# Patient Record
Sex: Female | Born: 1965 | Race: Black or African American | Hispanic: No | Marital: Single | Smoking: Former smoker
Health system: Southern US, Community
[De-identification: ages and names within clinical notes are randomized; demographics above are authoritative.]

## PROBLEM LIST (undated history)

## (undated) DIAGNOSIS — Z1211 Encounter for screening for malignant neoplasm of colon: Secondary | ICD-10-CM

## (undated) DIAGNOSIS — M1990 Unspecified osteoarthritis, unspecified site: Secondary | ICD-10-CM

## (undated) DIAGNOSIS — A6000 Herpesviral infection of urogenital system, unspecified: Secondary | ICD-10-CM

## (undated) DIAGNOSIS — K219 Gastro-esophageal reflux disease without esophagitis: Secondary | ICD-10-CM

## (undated) DIAGNOSIS — T7840XA Allergy, unspecified, initial encounter: Secondary | ICD-10-CM

## (undated) DIAGNOSIS — R1013 Epigastric pain: Secondary | ICD-10-CM

## (undated) DIAGNOSIS — U071 COVID-19: Secondary | ICD-10-CM

## (undated) DIAGNOSIS — G43909 Migraine, unspecified, not intractable, without status migrainosus: Secondary | ICD-10-CM

## (undated) DIAGNOSIS — I10 Essential (primary) hypertension: Secondary | ICD-10-CM

## (undated) DIAGNOSIS — D219 Benign neoplasm of connective and other soft tissue, unspecified: Secondary | ICD-10-CM

## (undated) DIAGNOSIS — E785 Hyperlipidemia, unspecified: Secondary | ICD-10-CM

## (undated) DIAGNOSIS — A630 Anogenital (venereal) warts: Secondary | ICD-10-CM

## (undated) DIAGNOSIS — D72820 Lymphocytosis (symptomatic): Secondary | ICD-10-CM

## (undated) HISTORY — DX: Unspecified osteoarthritis, unspecified site: M19.90

## (undated) HISTORY — DX: Herpesviral infection of urogenital system, unspecified: A60.00

## (undated) HISTORY — DX: Hyperlipidemia, unspecified: E78.5

## (undated) HISTORY — DX: COVID-19: U07.1

## (undated) HISTORY — DX: Allergy, unspecified, initial encounter: T78.40XA

## (undated) HISTORY — DX: Anogenital (venereal) warts: A63.0

## (undated) HISTORY — DX: Lymphocytosis (symptomatic): D72.820

## (undated) HISTORY — DX: Gastro-esophageal reflux disease without esophagitis: K21.9

## (undated) HISTORY — PX: EMBOLIZATION: SHX1496

## (undated) HISTORY — DX: Epigastric pain: R10.13

## (undated) HISTORY — DX: Encounter for screening for malignant neoplasm of colon: Z12.11

## (undated) HISTORY — DX: Benign neoplasm of connective and other soft tissue, unspecified: D21.9

## (undated) HISTORY — PX: ECTOPIC PREGNANCY SURGERY: SHX613

## (undated) HISTORY — DX: Migraine, unspecified, not intractable, without status migrainosus: G43.909

## (undated) HISTORY — DX: Essential (primary) hypertension: I10

---

## 2016-12-07 ENCOUNTER — Encounter (INDEPENDENT_AMBULATORY_CARE_PROVIDER_SITE_OTHER): Payer: Self-pay

## 2016-12-07 ENCOUNTER — Encounter: Payer: Self-pay | Admitting: Family Medicine

## 2016-12-07 ENCOUNTER — Ambulatory Visit (INDEPENDENT_AMBULATORY_CARE_PROVIDER_SITE_OTHER): Payer: BC Managed Care – PPO | Admitting: Family Medicine

## 2016-12-07 VITALS — BP 104/78 | HR 77 | Temp 98.3°F | Ht 68.25 in | Wt 211.4 lb

## 2016-12-07 DIAGNOSIS — Z86018 Personal history of other benign neoplasm: Secondary | ICD-10-CM

## 2016-12-07 DIAGNOSIS — Z23 Encounter for immunization: Secondary | ICD-10-CM | POA: Diagnosis not present

## 2016-12-07 DIAGNOSIS — E669 Obesity, unspecified: Secondary | ICD-10-CM | POA: Diagnosis not present

## 2016-12-07 DIAGNOSIS — Z1239 Encounter for other screening for malignant neoplasm of breast: Secondary | ICD-10-CM

## 2016-12-07 DIAGNOSIS — Z1231 Encounter for screening mammogram for malignant neoplasm of breast: Secondary | ICD-10-CM

## 2016-12-07 DIAGNOSIS — Z124 Encounter for screening for malignant neoplasm of cervix: Secondary | ICD-10-CM

## 2016-12-07 DIAGNOSIS — Z0000 Encounter for general adult medical examination without abnormal findings: Secondary | ICD-10-CM

## 2016-12-07 LAB — LIPID PANEL
CHOLESTEROL: 191 mg/dL (ref 0–200)
HDL: 65.9 mg/dL (ref >39.00)
LDL Cholesterol: 108 mg/dL — ABNORMAL HIGH (ref 0–99)
NonHDL: 124.74
Total CHOL/HDL Ratio: 3
Triglycerides: 84 mg/dL (ref 0.0–149.0)
VLDL: 16.8 mg/dL (ref 0.0–40.0)

## 2016-12-07 LAB — COMPREHENSIVE METABOLIC PANEL
ALBUMIN: 4.3 g/dL (ref 3.5–5.2)
ALK PHOS: 40 U/L (ref 39–117)
ALT: 19 U/L (ref 0–35)
AST: 20 U/L (ref 0–37)
BUN: 13 mg/dL (ref 6–23)
CALCIUM: 9.7 mg/dL (ref 8.4–10.5)
CO2: 28 mEq/L (ref 19–32)
CREATININE: 0.94 mg/dL (ref 0.40–1.20)
Chloride: 104 mEq/L (ref 96–112)
GFR: 67.41 mL/min (ref >60.00)
Glucose, Bld: 94 mg/dL (ref 70–99)
POTASSIUM: 3.9 meq/L (ref 3.5–5.1)
SODIUM: 138 meq/L (ref 135–145)
TOTAL PROTEIN: 7.1 g/dL (ref 6.0–8.3)
Total Bilirubin: 0.4 mg/dL (ref 0.2–1.2)

## 2016-12-07 LAB — CBC
HEMATOCRIT: 40.1 % (ref 36.0–46.0)
Hemoglobin: 13.1 g/dL (ref 12.0–15.0)
MCHC: 32.6 g/dL (ref 30.0–36.0)
MCV: 82.8 fl (ref 78.0–100.0)
PLATELETS: 193 10*3/uL (ref 150.0–400.0)
RBC: 4.85 Mil/uL (ref 3.87–5.11)
RDW: 13.7 % (ref 11.5–15.5)
WBC: 5 10*3/uL (ref 4.0–10.5)

## 2016-12-07 LAB — TSH: TSH: 1.19 u[IU]/mL (ref 0.35–4.50)

## 2016-12-07 LAB — HEMOGLOBIN A1C: HEMOGLOBIN A1C: 5.8 % (ref 4.6–6.5)

## 2016-12-07 NOTE — Patient Instructions (Signed)
We will arrange the referral to GYN.  We will call your lab results.  Follow up annually.  Take care  Dr. Lacinda Axon   Health Maintenance, Female Adopting a healthy lifestyle and getting preventive care can go a long way to promote health and wellness. Talk with your health care provider about what schedule of regular examinations is right for you. This is a good chance for you to check in with your provider about disease prevention and staying healthy. In between checkups, there are plenty of things you can do on your own. Experts have done a lot of research about which lifestyle changes and preventive measures are most likely to keep you healthy. Ask your health care provider for more information. Weight and diet Eat a healthy diet  Be sure to include plenty of vegetables, fruits, low-fat dairy products, and lean protein.  Do not eat a lot of foods high in solid fats, added sugars, or salt.  Get regular exercise. This is one of the most important things you can do for your health.  Most adults should exercise for at least 150 minutes each week. The exercise should increase your heart rate and make you sweat (moderate-intensity exercise).  Most adults should also do strengthening exercises at least twice a week. This is in addition to the moderate-intensity exercise. Maintain a healthy weight  Body mass index (BMI) is a measurement that can be used to identify possible weight problems. It estimates body fat based on height and weight. Your health care provider can help determine your BMI and help you achieve or maintain a healthy weight.  For females 62 years of age and older:  A BMI below 18.5 is considered underweight.  A BMI of 18.5 to 24.9 is normal.  A BMI of 25 to 29.9 is considered overweight.  A BMI of 30 and above is considered obese. Watch levels of cholesterol and blood lipids  You should start having your blood tested for lipids and cholesterol at 49 years of age, then  have this test every 5 years.  You may need to have your cholesterol levels checked more often if:  Your lipid or cholesterol levels are high.  You are older than 49 years of age.  You are at high risk for heart disease. Cancer screening Lung Cancer  Lung cancer screening is recommended for adults 40-52 years old who are at high risk for lung cancer because of a history of smoking.  A yearly low-dose CT scan of the lungs is recommended for people who:  Currently smoke.  Have quit within the past 15 years.  Have at least a 30-pack-year history of smoking. A pack year is smoking an average of one pack of cigarettes a day for 1 year.  Yearly screening should continue until it has been 15 years since you quit.  Yearly screening should stop if you develop a health problem that would prevent you from having lung cancer treatment. Breast Cancer  Practice breast self-awareness. This means understanding how your breasts normally appear and feel.  It also means doing regular breast self-exams. Let your health care provider know about any changes, no matter how small.  If you are in your 20s or 30s, you should have a clinical breast exam (CBE) by a health care provider every 1-3 years as part of a regular health exam.  If you are 20 or older, have a CBE every year. Also consider having a breast X-ray (mammogram) every year.  If you have  a family history of breast cancer, talk to your health care provider about genetic screening.  If you are at high risk for breast cancer, talk to your health care provider about having an MRI and a mammogram every year.  Breast cancer gene (BRCA) assessment is recommended for women who have family members with BRCA-related cancers. BRCA-related cancers include:  Breast.  Ovarian.  Tubal.  Peritoneal cancers.  Results of the assessment will determine the need for genetic counseling and BRCA1 and BRCA2 testing. Cervical Cancer  Your health care  provider may recommend that you be screened regularly for cancer of the pelvic organs (ovaries, uterus, and vagina). This screening involves a pelvic examination, including checking for microscopic changes to the surface of your cervix (Pap test). You may be encouraged to have this screening done every 3 years, beginning at age 62.  For women ages 86-65, health care providers may recommend pelvic exams and Pap testing every 3 years, or they may recommend the Pap and pelvic exam, combined with testing for human papilloma virus (HPV), every 5 years. Some types of HPV increase your risk of cervical cancer. Testing for HPV may also be done on women of any age with unclear Pap test results.  Other health care providers may not recommend any screening for nonpregnant women who are considered low risk for pelvic cancer and who do not have symptoms. Ask your health care provider if a screening pelvic exam is right for you.  If you have had past treatment for cervical cancer or a condition that could lead to cancer, you need Pap tests and screening for cancer for at least 20 years after your treatment. If Pap tests have been discontinued, your risk factors (such as having a new sexual partner) need to be reassessed to determine if screening should resume. Some women have medical problems that increase the chance of getting cervical cancer. In these cases, your health care provider may recommend more frequent screening and Pap tests. Colorectal Cancer  This type of cancer can be detected and often prevented.  Routine colorectal cancer screening usually begins at 49 years of age and continues through 49 years of age.  Your health care provider may recommend screening at an earlier age if you have risk factors for colon cancer.  Your health care provider may also recommend using home test kits to check for hidden blood in the stool.  A small camera at the end of a tube can be used to examine your colon directly  (sigmoidoscopy or colonoscopy). This is done to check for the earliest forms of colorectal cancer.  Routine screening usually begins at age 56.  Direct examination of the colon should be repeated every 5-10 years through 49 years of age. However, you may need to be screened more often if early forms of precancerous polyps or small growths are found. Skin Cancer  Check your skin from head to toe regularly.  Tell your health care provider about any new moles or changes in moles, especially if there is a change in a mole's shape or color.  Also tell your health care provider if you have a mole that is larger than the size of a pencil eraser.  Always use sunscreen. Apply sunscreen liberally and repeatedly throughout the day.  Protect yourself by wearing long sleeves, pants, a wide-brimmed hat, and sunglasses whenever you are outside. Heart disease, diabetes, and high blood pressure  High blood pressure causes heart disease and increases the risk of stroke. High  blood pressure is more likely to develop in:  People who have blood pressure in the high end of the normal range (130-139/85-89 mm Hg).  People who are overweight or obese.  People who are African American.  If you are 44-47 years of age, have your blood pressure checked every 3-5 years. If you are 71 years of age or older, have your blood pressure checked every year. You should have your blood pressure measured twice-once when you are at a hospital or clinic, and once when you are not at a hospital or clinic. Record the average of the two measurements. To check your blood pressure when you are not at a hospital or clinic, you can use:  An automated blood pressure machine at a pharmacy.  A home blood pressure monitor.  If you are between 19 years and 21 years old, ask your health care provider if you should take aspirin to prevent strokes.  Have regular diabetes screenings. This involves taking a blood sample to check your  fasting blood sugar level.  If you are at a normal weight and have a low risk for diabetes, have this test once every three years after 49 years of age.  If you are overweight and have a high risk for diabetes, consider being tested at a younger age or more often. Preventing infection Hepatitis B  If you have a higher risk for hepatitis B, you should be screened for this virus. You are considered at high risk for hepatitis B if:  You were born in a country where hepatitis B is common. Ask your health care provider which countries are considered high risk.  Your parents were born in a high-risk country, and you have not been immunized against hepatitis B (hepatitis B vaccine).  You have HIV or AIDS.  You use needles to inject street drugs.  You live with someone who has hepatitis B.  You have had sex with someone who has hepatitis B.  You get hemodialysis treatment.  You take certain medicines for conditions, including cancer, organ transplantation, and autoimmune conditions. Hepatitis C  Blood testing is recommended for:  Everyone born from [birth year] through 1963.  Anyone with known risk factors for hepatitis C. Sexually transmitted infections (STIs)  You should be screened for sexually transmitted infections (STIs) including gonorrhea and chlamydia if:  You are sexually active and are younger than 49 years of age.  You are older than 49 years of age and your health care provider tells you that you are at risk for this type of infection.  Your sexual activity has changed since you were last screened and you are at an increased risk for chlamydia or gonorrhea. Ask your health care provider if you are at risk.  If you do not have HIV, but are at risk, it may be recommended that you take a prescription medicine daily to prevent HIV infection. This is called pre-exposure prophylaxis (PrEP). You are considered at risk if:  You are sexually active and do not regularly use condoms or  know the HIV status of your partner(s).  You take drugs by injection.  You are sexually active with a partner who has HIV. Talk with your health care provider about whether you are at high risk of being infected with HIV. If you choose to begin PrEP, you should first be tested for HIV. You should then be tested every 3 months for as long as you are taking PrEP. Pregnancy  If you are premenopausal and you  may become pregnant, ask your health care provider about preconception counseling.  If you may become pregnant, take 400 to 800 micrograms (mcg) of folic acid every day.  If you want to prevent pregnancy, talk to your health care provider about birth control (contraception). Osteoporosis and menopause  Osteoporosis is a disease in which the bones lose minerals and strength with aging. This can result in serious bone fractures. Your risk for osteoporosis can be identified using a bone density scan.  If you are 19 years of age or older, or if you are at risk for osteoporosis and fractures, ask your health care provider if you should be screened.  Ask your health care provider whether you should take a calcium or vitamin D supplement to lower your risk for osteoporosis.  Menopause may have certain physical symptoms and risks.  Hormone replacement therapy may reduce some of these symptoms and risks. Talk to your health care provider about whether hormone replacement therapy is right for you. Follow these instructions at home:  Schedule regular health, dental, and eye exams.  Stay current with your immunizations.  Do not use any tobacco products including cigarettes, chewing tobacco, or electronic cigarettes.  If you are pregnant, do not drink alcohol.  If you are breastfeeding, limit how much and how often you drink alcohol.  Limit alcohol intake to no more than 1 drink per day for nonpregnant women. One drink equals 12 ounces of beer, 5 ounces of wine, or 1 ounces of hard  liquor.  Do not use street drugs.  Do not share needles.  Ask your health care provider for help if you need support or information about quitting drugs.  Tell your health care provider if you often feel depressed.  Tell your health care provider if you have ever been abused or do not feel safe at home. This information is not intended to replace advice given to you by your health care provider. Make sure you discuss any questions you have with your health care provider. Document Released: 01/17/2011 Document Revised: 12/10/2015 Document Reviewed: 04/07/2015 Elsevier Interactive Patient Education  2017 [employer].

## 2016-12-08 ENCOUNTER — Encounter: Payer: Self-pay | Admitting: Family Medicine

## 2016-12-08 ENCOUNTER — Telehealth: Payer: Self-pay | Admitting: Family Medicine

## 2016-12-08 DIAGNOSIS — A6000 Herpesviral infection of urogenital system, unspecified: Secondary | ICD-10-CM | POA: Insufficient documentation

## 2016-12-08 DIAGNOSIS — G43909 Migraine, unspecified, not intractable, without status migrainosus: Secondary | ICD-10-CM | POA: Insufficient documentation

## 2016-12-08 DIAGNOSIS — I10 Essential (primary) hypertension: Secondary | ICD-10-CM | POA: Insufficient documentation

## 2016-12-08 DIAGNOSIS — Z0000 Encounter for general adult medical examination without abnormal findings: Secondary | ICD-10-CM

## 2016-12-08 HISTORY — DX: Encounter for general adult medical examination without abnormal findings: Z00.00

## 2016-12-08 MED ORDER — VALACYCLOVIR HCL 500 MG PO TABS: 500.0000 mg | ORAL_TABLET | Freq: Two times a day (BID) | ORAL | 3 refills | Status: DC | PRN

## 2016-12-08 NOTE — Progress Notes (Signed)
Subjective:  Patient ID: Nichole Rodriguez, female    DOB: 07/14/1966  Age: 51 y.o. MRN: [id number]  CC: Annual exam/establish care.  HPI Nichole Rodriguez is a 51 y.o. female presents to the clinic today to establish care & to have an annual physical exam.  Preventative Healthcare  Pap smear: In need of. Prefers to see GYN given history of fibroid and heavy bleeding.  Mammogram: In need of.   Colonoscopy: Not indicated.   Immunizations - needs Tdap.  Labs: Labs today.  Alcohol use: See below.  Smoking/tobacco use: Former smoker.   PMH, Surgical Hx, Family Hx, Social History reviewed and updated as below.  Past Medical History:  Diagnosis Date  . Genital herpes   . Genital warts   . Hypertension   . Migraine    Past Surgical History:  Procedure Laterality Date  . BREAST BIOPSY    . CHOLECYSTECTOMY    . ECTOPIC PREGNANCY SURGERY    . EMBOLIZATION     For fibroid   Family History  Problem Relation Age of Onset  . Arthritis Mother   . Diabetes Mother   . Diabetes Father   . Cancer Brother   . Cancer Maternal Grandfather    Social History  Substance Use Topics  . Smoking status: Former Smoker    Quit date: 2007  . Smokeless tobacco: Never Used  . Alcohol use 1.2 oz/week    2 Glasses of wine per week     Comment: per 2 weeks     Review of Systems  Eyes: Positive for visual disturbance.  Gastrointestinal: Positive for abdominal pain.  Neurological: Positive for headaches.  All other systems reviewed and are negative.   Objective:   Today's Vitals: BP 104/78 (BP Location: Left Arm, Patient Position: Sitting, Cuff Size: Normal)   Pulse 77   Temp 98.3 F (36.8 C) (Oral)   Ht 5' 8.25" (1.734 m)   Wt 211 lb 6 oz (95.9 kg)   SpO2 97%   BMI 31.90 kg/m   Physical Exam  Constitutional: She is oriented to person, place, and time. She appears well-developed and well-nourished. No distress.  HENT:  Head: Normocephalic and atraumatic.  Nose: Nose normal.    Mouth/Throat: Oropharynx is clear and moist. No oropharyngeal exudate.  Normal TM's bilaterally.   Eyes: Conjunctivae are normal. No scleral icterus.  Neck: Neck supple.  Cardiovascular: Normal rate and regular rhythm.   No murmur heard. Pulmonary/Chest: Effort normal and breath sounds normal. She has no wheezes. She has no rales.  Abdominal: Soft. She exhibits no mass. There is no rebound and no guarding.  Mildly tender to palpation in the lower abdomen.  Musculoskeletal: Normal range of motion. She exhibits no edema.  Lymphadenopathy:    She has no cervical adenopathy.  Neurological: She is alert and oriented to person, place, and time.  Skin: Skin is warm and dry. No rash noted.  Psychiatric: She has a normal mood and affect.  Vitals reviewed.  Assessment & Plan:   Problem List Items Addressed This Visit    Annual physical exam - Primary    Referring to GYN per patient. Mammogram ordered.  Tdap today. Labs today.      Relevant Orders   CBC (Completed)   Comprehensive metabolic panel (Completed)   Lipid panel (Completed)   TSH (Completed)   Hemoglobin A1c (Completed)    Other Visit Diagnoses    Obesity (BMI 30-39.9)       Need for diphtheria-tetanus-pertussis (Tdap) vaccine  Relevant Orders   Tdap vaccine greater than or equal to 7yo IM (Completed)   Breast cancer screening       Relevant Orders   MM Digital Screening   History of uterine fibroid       Relevant Orders   Ambulatory referral to Gynecology   Cervical cancer screening       Relevant Orders   Ambulatory referral to Gynecology      Meds ordered this encounter  Medications  . amitriptyline (ELAVIL) 10 MG tablet    Sig: Take 10 mg by mouth at bedtime.   Marland Kitchen lisinopril-hydrochlorothiazide (PRINZIDE,ZESTORETIC) 20-12.5 MG tablet    Sig: Take 1 tablet by mouth daily.   . valACYclovir (VALTREX) 500 MG tablet    Sig: Take 500 mg by mouth 2 (two) times daily.   . cyclobenzaprine (FLEXERIL) 10 MG  tablet    Sig: Take 10 mg by mouth 3 (three) times daily as needed for muscle spasms.    Follow-up: Annually  [place]

## 2016-12-08 NOTE — Telephone Encounter (Signed)
Left voice mail to call back 

## 2016-12-08 NOTE — Telephone Encounter (Signed)
Pt called back returning your call from yesterday. Pt will be available until 11. Pt states that you may leave a message. Please advise, thank you!  Call pt @ [phone]

## 2016-12-08 NOTE — Telephone Encounter (Signed)
Spoke with patient in regards to refill on valacyclovir would like sent into [facility] .   Advised patient script sent to [facility].

## 2016-12-08 NOTE — Assessment & Plan Note (Signed)
Referring to GYN per patient. Mammogram ordered.  Tdap today. Labs today.

## 2017-01-04 ENCOUNTER — Telehealth: Payer: Self-pay | Admitting: Family Medicine

## 2017-01-04 NOTE — Telephone Encounter (Signed)
Ok. Thank you.

## 2017-01-04 NOTE — Telephone Encounter (Signed)
Pt called back returning your call. Please advise,thank you!  Call pt @ [phone]

## 2017-01-05 ENCOUNTER — Encounter: Payer: Self-pay | Admitting: Family Medicine

## 2017-01-23 ENCOUNTER — Encounter (HOSPITAL_COMMUNITY): Payer: Self-pay

## 2017-01-23 ENCOUNTER — Ambulatory Visit
Admission: RE | Admit: 2017-01-23 | Discharge: 2017-01-23 | Disposition: A | Discharge status: Home / Self Care | Payer: BC Managed Care – PPO | Source: Ambulatory Visit | Attending: Family Medicine | Admitting: Family Medicine

## 2017-01-23 DIAGNOSIS — Z1231 Encounter for screening mammogram for malignant neoplasm of breast: Secondary | ICD-10-CM | POA: Diagnosis not present

## 2017-01-26 ENCOUNTER — Other Ambulatory Visit: Payer: Self-pay | Admitting: *Deleted

## 2017-01-26 ENCOUNTER — Inpatient Hospital Stay
Admission: RE | Admit: 2017-01-26 | Discharge: 2017-01-26 | Disposition: A | Discharge status: Home / Self Care | Payer: Self-pay | Source: Ambulatory Visit | Attending: *Deleted | Admitting: *Deleted

## 2017-01-26 DIAGNOSIS — Z9289 Personal history of other medical treatment: Secondary | ICD-10-CM

## 2017-02-06 ENCOUNTER — Telehealth: Payer: Self-pay | Admitting: Family Medicine

## 2017-02-06 NOTE — Telephone Encounter (Signed)
Pt called about needing a refill fo rlisinopril-hydrochlorothiazide (PRINZIDE,ZESTORETIC) 20-12.5 MG tablet.  Pharmacy is [place], [place] - [place]  Call pt @ [phone].Thank you!

## 2017-02-07 ENCOUNTER — Other Ambulatory Visit: Payer: Self-pay | Admitting: Family Medicine

## 2017-02-07 MED ORDER — LISINOPRIL-HYDROCHLOROTHIAZIDE 20-12.5 MG PO TABS: 1.0000 | ORAL_TABLET | Freq: Every day | ORAL | 3 refills | Status: DC

## 2017-02-07 NOTE — Telephone Encounter (Signed)
Last OV 12/07/16 filed under historical

## 2017-02-07 NOTE — Telephone Encounter (Signed)
Rx sent 

## 2017-07-03 ENCOUNTER — Ambulatory Visit: Payer: BLUE CROSS/BLUE SHIELD | Admitting: Obstetrics and Gynecology

## 2017-07-03 ENCOUNTER — Encounter: Payer: Self-pay | Admitting: Obstetrics and Gynecology

## 2017-07-03 VITALS — BP 105/69 | HR 72 | Ht 68.25 in | Wt 212.4 lb

## 2017-07-03 DIAGNOSIS — Z9889 Other specified postprocedural states: Secondary | ICD-10-CM | POA: Diagnosis not present

## 2017-07-03 DIAGNOSIS — R102 Pelvic and perineal pain unspecified side: Secondary | ICD-10-CM | POA: Insufficient documentation

## 2017-07-03 DIAGNOSIS — D219 Benign neoplasm of connective and other soft tissue, unspecified: Secondary | ICD-10-CM | POA: Diagnosis not present

## 2017-07-03 DIAGNOSIS — Z124 Encounter for screening for malignant neoplasm of cervix: Secondary | ICD-10-CM | POA: Diagnosis not present

## 2017-07-03 HISTORY — DX: Pelvic and perineal pain: R10.2

## 2017-07-03 HISTORY — DX: Other specified postprocedural states: Z98.890

## 2017-07-03 HISTORY — DX: Benign neoplasm of connective and other soft tissue, unspecified: D21.9

## 2017-07-03 NOTE — Progress Notes (Signed)
GYN ENCOUNTER NOTE  Subjective:       Nichole Rodriguez is a 51 y.o. G58P1011 female is here for gynecologic evaluation of the following issues:  1.  Symptomatic fibroid uterus.    Pelvic pain has been off and on over the past several years.  Pain is typically left-sided, crampy/achy, lasting minutes, and often responding to ibuprofen.  Pain does not disrupt activities of daily living. Currently with normal regular menstrual cycles. Monthly intervals. Duration of flow 6 days - 3 days light, 3 days heavy without clots. Dysmenorrhea 4/10, responsive to ibuprofen No intermenstrual bleeding No postcoital bleeding No deep thrusting dyspareunia History of abnormal Pap smears for years; does not recall if she ever had dysplasia; last Pap smear was several years ago in [place]. Status post uterine artery embolization. History of laparoscopic left salpingectomy for ectopic pregnancy  Obstetric History OB History  Gravida Para Term Preterm AB Living  2 1 1   1 1   SAB TAB Ectopic Multiple Live Births      1   1    # Outcome Date GA Lbr Len/2nd Weight Sex Delivery Anes PTL Lv  2 Ectopic 1991          1 Term 1987   5 lb 4.8 oz (2.404 kg) F Vag-Spont   LIV      Past Medical History:  Diagnosis Date  . Genital herpes   . Genital warts   . Hypertension   . Migraine     Past Surgical History:  Procedure Laterality Date  . CHOLECYSTECTOMY    . ECTOPIC PREGNANCY SURGERY Left   . EMBOLIZATION     For fibroid    Current Outpatient Medications on File Prior to Visit  Medication Sig Dispense Refill  . amitriptyline (ELAVIL) 10 MG tablet Take 10 mg by mouth at bedtime.     . cyclobenzaprine (FLEXERIL) 10 MG tablet Take 10 mg by mouth 3 (three) times daily as needed for muscle spasms.    Marland Kitchen lisinopril-hydrochlorothiazide (PRINZIDE,ZESTORETIC) 20-12.5 MG tablet Take 1 tablet by mouth daily. 90 tablet 3  . valACYclovir (VALTREX) 500 MG tablet Take 1 tablet (500 mg total) by mouth 2  (two) times daily as needed. 90 tablet 3   No current facility-administered medications on file prior to visit.     No Known Allergies  Social History   Socioeconomic History  . Marital status: Single    Spouse name: Not on file  . Number of children: Not on file  . Years of education: Not on file  . Highest education level: Not on file  Social Needs  . Financial resource strain: Not on file  . Food insecurity - worry: Not on file  . Food insecurity - inability: Not on file  . Transportation needs - medical: Not on file  . Transportation needs - non-medical: Not on file  Occupational History  . Not on file  Tobacco Use  . Smoking status: Former Smoker    Last attempt to quit: 2007    Years since quitting: 11.9  . Smokeless tobacco: Never Used  Substance and Sexual Activity  . Alcohol use: Yes    Alcohol/week: 1.2 oz    Types: 2 Glasses of wine per week    Comment: per 2 weeks   . Drug use: No  . Sexual activity: Yes    Birth control/protection: Condom  Other Topics Concern  . Not on file  Social History Narrative  . Not on file  Family History  Problem Relation Age of Onset  . Arthritis Mother   . Diabetes Mother   . Diabetes Father   . Cancer Brother   . Cancer Maternal Grandfather   . Breast cancer Maternal Aunt   . Ovarian cancer Maternal Aunt   . Colon cancer Neg Hx     The following portions of the patient's history were reviewed and updated as appropriate: allergies, current medications, past family history, past medical history, past social history, past surgical history and problem list.  Review of Systems Review of Systems-comprehensive review of systems is negative except for that noted in the HPI  Objective:   BP 105/69   Pulse 72   Ht 5' 8.25" (1.734 m)   Wt 212 lb 6.4 oz (96.3 kg)   LMP 05/15/2017 (Exact Date)   BMI 32.06 kg/m  CONSTITUTIONAL: Well-developed, well-nourished female in no acute distress.  HENT:  Normocephalic,  atraumatic.  NECK: Normal range of motion, supple, no masses.  Normal thyroid.  SKIN: Skin is warm and dry. No rash noted. Not diaphoretic. No erythema. No pallor. [place]: Alert and oriented to person, place, and time. PSYCHIATRIC: Normal mood and affect. Normal behavior. Normal judgment and thought content. CARDIOVASCULAR:Not Examined RESPIRATORY: Not Examined BREASTS: Not Examined BACK: No CVA tenderness ABDOMEN: Soft, non distended; Non tender.  No Organomegaly. PELVIC:  External Genitalia: Normal  BUS: Normal  Vagina: Normal  Cervix: Normal; no lesions; no cervical motion tenderness  Uterus: 14-16 weeks size; mobile, nontender, extends towards lateral pelvic sidewalls; cul-de-sac empty  Adnexa: Normal; nonpalpable nontender  RV: Normal external exam  Bladder: Nontender MUSCULOSKELETAL: Normal range of motion. No tenderness.  No cyanosis, clubbing, or edema.     Assessment:   1. Fibroids, 14-16 weeks size - US PELVIS (TRANSABDOMINAL ONLY); Future - US PELVIS TRANSVANGINAL NON-OB (TV ONLY); Future  2. Pelvic pain in female, nonacute - US PELVIS (TRANSABDOMINAL ONLY); Future - US PELVIS TRANSVANGINAL NON-OB (TV ONLY); Future  3. Status post embolization of uterine artery   4.  History of abnormal Pap smears  Plan:   1.  Pap smear 2.  Pelvic ultrasound 3.  Return in 2 weeks for further follow-up and management planning 4.  Options of managing pelvic pain and menorrhagia were discussed  A total of 30 minutes were spent face-to-face with the patient during the encounter with greater than 50% dealing with counseling and coordination of care.  Brayton Mars, MD  Note: This dictation was prepared with Dragon dictation along with smaller phrase technology. Any transcriptional errors that result from this process are unintentional.

## 2017-07-03 NOTE — Patient Instructions (Signed)
1.  Pap smear is done today 2.  Ultrasound is scheduled to assess uterine fibroids 3.  Return the first week of January for follow-up on ultrasound and further management planning 4.  Consider taking multivitamin with iron in order to help minimize anemia symptoms   Uterine Fibroids Uterine fibroids are tissue masses (tumors) that can develop in the womb (uterus). They are also called leiomyomas. This type of tumor is not cancerous (benign) and does not spread to other parts of the body outside of the pelvic area, which is between the hip bones. Occasionally, fibroids may develop in the fallopian tubes, in the cervix, or on the support structures (ligaments) that surround the uterus. You can have one or many fibroids. Fibroids can vary in size, weight, and where they grow in the uterus. Some can become quite large. Most fibroids do not require medical treatment. What are the causes? A fibroid can develop when a single uterine cell keeps growing (replicating). Most cells in the human body have a control mechanism that keeps them from replicating without control. What are the signs or symptoms? Symptoms may include:  Heavy bleeding during your period.  Bleeding or spotting between periods.  Pelvic pain and pressure.  Bladder problems, such as needing to urinate more often (urinary frequency) or urgently.  Inability to reproduce offspring (infertility).  Miscarriages.  How is this diagnosed? Uterine fibroids are diagnosed through a physical exam. Your health care provider may feel the lumpy tumors during a pelvic exam. Ultrasonography and an MRI may be done to determine the size, location, and number of fibroids. How is this treated? Treatment may include:  Watchful waiting. This involves getting the fibroid checked by your health care provider to see if it grows or shrinks. Follow your health care provider's recommendations for how often to have this checked.  Hormone medicines. These  can be taken by mouth or given through an intrauterine device (IUD).  Surgery. ? Removing the fibroids (myomectomy) or the uterus (hysterectomy). ? Removing blood supply to the fibroids (uterine artery embolization).  If fibroids interfere with your fertility and you want to become pregnant, your health care provider may recommend having the fibroids removed. Follow these instructions at home:  Keep all follow-up visits as directed by your health care provider. This is important.  Take over-the-counter and prescription medicines only as told by your health care provider. ? If you were prescribed a hormone treatment, take the hormone medicines exactly as directed.  Ask your health care provider about taking iron pills and increasing the amount of dark green, leafy vegetables in your diet. These actions can help to boost your blood iron levels, which may be affected by heavy menstrual bleeding.  Pay close attention to your period and tell your health care provider about any changes, such as: ? Increased blood flow that requires you to use more pads or tampons than usual per month. ? A change in the number of days that your period lasts per month. ? A change in symptoms that are associated with your period, such as abdominal cramping or back pain. Contact a health care provider if:  You have pelvic pain, back pain, or abdominal cramps that cannot be controlled with medicines.  You have an increase in bleeding between and during periods.  You soak tampons or pads in a half hour or less.  You feel lightheaded, extra tired, or weak. Get help right away if:  You faint.  You have a sudden increase in pelvic  pain. This information is not intended to replace advice given to you by your health care provider. Make sure you discuss any questions you have with your health care provider. Document Released: 07/01/2000 Document Revised: 03/03/2016 Document Reviewed: 12/31/2013 Elsevier Interactive  Patient Education  [employer].

## 2017-07-03 NOTE — Addendum Note (Signed)
Addended by: Elouise Munroe on: 07/03/2017 02:30 PM   Modules accepted: Orders

## 2017-07-05 LAB — IGP, COBASHPV16/18
HPV 16: NEGATIVE
HPV 18: NEGATIVE
HPV other hr types: NEGATIVE
PAP SMEAR COMMENT: 0

## 2017-07-19 ENCOUNTER — Ambulatory Visit: Payer: BLUE CROSS/BLUE SHIELD | Admitting: Obstetrics and Gynecology

## 2017-07-19 ENCOUNTER — Ambulatory Visit (INDEPENDENT_AMBULATORY_CARE_PROVIDER_SITE_OTHER): Payer: BLUE CROSS/BLUE SHIELD

## 2017-07-19 DIAGNOSIS — Z9889 Other specified postprocedural states: Secondary | ICD-10-CM

## 2017-07-19 DIAGNOSIS — R102 Pelvic and perineal pain: Secondary | ICD-10-CM | POA: Diagnosis not present

## 2017-07-19 DIAGNOSIS — D219 Benign neoplasm of connective and other soft tissue, unspecified: Secondary | ICD-10-CM

## 2017-07-19 NOTE — Progress Notes (Signed)
Chief complaint: 1.  Uterine fibroids 2.  Pelvic pain  Patient was to present today for follow-up after ultrasound.  Her exam has not been completed. Pelvic ultrasound is scheduled for later today.  Patient will return in 1 week after the exam is complete for further management planning.  ASSESSMENT: 1.  Uterine fibroids 2.  Ultrasound pending  PLAN: 1.  Ultrasound today 2.  Follow-up in 1 week.  Brayton Mars, MD  Note: This dictation was prepared with Dragon dictation along with smaller phrase technology. Any transcriptional errors that result from this process are unintentional.

## 2017-07-19 NOTE — Patient Instructions (Signed)
1.  Pelvic ultrasound is scheduled 2.  Follow-up 1 week after ultrasound

## 2017-08-09 ENCOUNTER — Encounter: Payer: BLUE CROSS/BLUE SHIELD | Admitting: Obstetrics and Gynecology

## 2017-08-22 ENCOUNTER — Encounter: Payer: BLUE CROSS/BLUE SHIELD | Admitting: Obstetrics and Gynecology

## 2017-08-24 ENCOUNTER — Ambulatory Visit: Payer: BLUE CROSS/BLUE SHIELD | Admitting: Obstetrics and Gynecology

## 2017-08-24 ENCOUNTER — Encounter: Payer: Self-pay | Admitting: Obstetrics and Gynecology

## 2017-08-24 VITALS — BP 119/80 | HR 75 | Ht 68.25 in | Wt 209.9 lb

## 2017-08-24 DIAGNOSIS — D219 Benign neoplasm of connective and other soft tissue, unspecified: Secondary | ICD-10-CM

## 2017-08-24 DIAGNOSIS — Z9889 Other specified postprocedural states: Secondary | ICD-10-CM

## 2017-08-24 NOTE — Patient Instructions (Signed)
1.  Maintain menstrual calendar monitoring of bleeding 2.  Return in 6 months for pelvic exam to assess for any interval change of fibroids.

## 2017-08-24 NOTE — Progress Notes (Signed)
Chief complaint: 1.  Uterine fibroids 2.  Follow-up after ultrasound  The patient presents today for follow-up on her pelvic ultrasound for assessment of uterine fibroids.  She is status post uterine artery embolization in the past. Menses are regular on a monthly basis and she has 3 days of heavy bleeding followed by 3 days of lighter bleeding. She does have some mild to moderate dysmenorrhea. There is no intermenstrual bleeding.  Ultrasound report: ULTRASOUND REPORT  Location: [facility] Date of Service:  07/19/17   Indications: Fibroids; Pelvic Pain Findings:  The uterus measures 10.7 x 7.1 x 7.2 cm. Echo texture is heterogeneous with evidence of focal masses. Within the uterus are multiple suspected fibroids measuring: Fibroid 1: Left lateral, Subserosal, 1.4 x 1.2 x 0.9 cm Fibroid 2: Posterior, Intramural, 4.0 x 4.0 x 3.9 cm Fibroid 3: ? Submucosal, 2.2 x 2.0 x 2.3 cm Fibroid 4: Right lateral, Subserosal, 2.6 x 2.4 x 2.3 cm The Endometrium measures 5.3 mm.  Neither ovary was visualized due to the presence of fibroids and overlying bowel gas. Survey of the adnexa demonstrates no adnexal masses. There is no free fluid in the cul de sac.  Impression: 1. Anteverted uterus appears enlarged and contains multiple fibroids. 2.  The largest fibroid measures 4.0 x 4.0 x 3.9 cm. 3. The endometrium measures 5.3 mm. 4. Neither ovary was visualized.  Recommendations: 1.Clinical correlation with the patient's History and Physical Exam.   Dario Ave, RDMS Brayton Mars, MD  OBJECTIVE: BP 119/80   Pulse 75   Ht 5' 8.25" (1.734 m)   Wt 209 lb 14.4 oz (95.2 kg)   LMP 07/24/2017 (Approximate)   BMI 31.68 kg/m  Physical exam-deferred  ASSESSMENT: 1.  Multi-fibroid uterus-14-16 weeks size on pelvic exam 2.  Status post uterine artery embolization 3.  Regular menstrual cycles with moderate bleeding and mild dysmenorrhea  PLAN: 1.  Options of  management were reviewed including close observation versus medical management versus surgery.  Patient has decided to follow-up with close observation. 2.  Maintain menstrual calendar monitoring over the next 6 months 3.  Return in 6 months for pelvic exam to assess for interval change of the uterine fibroids 4.  She understands that she may develop climacteric symptoms leading up to menopause.  A total of 15 minutes were spent face-to-face with the patient during this encounter and over half of that time dealt with counseling and coordination of care.  Brayton Mars, MD  Note: This dictation was prepared with Dragon dictation along with smaller phrase technology. Any transcriptional errors that result from this process are unintentional.

## 2017-09-05 ENCOUNTER — Ambulatory Visit: Payer: Self-pay | Admitting: *Deleted

## 2017-09-05 NOTE — Telephone Encounter (Signed)
If needs appt sooner could try to work in with nurse practitioner here or go to urgent care/ed if I dont have availability   [employer]

## 2017-09-05 NOTE — Telephone Encounter (Signed)
Pt states that she was sick last week and took OTC sinus medication, but she is dizziness; she is also having tingling in her right hand; also the pt says that the last time she felt like that her "she felt like that she had to get a transfusion"; the pt states that Michael Boston was to give her a prescription for iron; her greatest concern is dizziness; the pt said that the tingling in her right hand started this morning; she said that her hand was feeling tight when she woke up this morning; she describes it as "feeling like her hand was asleep"per nurse triage recommendations made to include seeing a physician within 4 hours; pt verbalizes understanding but would like an appointment on Monday 2/25; she also states that she will go to ED if her symptoms worsen; pt offered and accepted appointment with Dr McLean-Scocuzza 09/12/27 at 1430 to transfer care from [facility], and to discuss the issues listed above; pt verbalizes understanding.      Reason for Disposition . [1] Tingling (e.g., pins and needles) of the face, arm / hand, or leg / foot on one side of the body AND [2] present now  Answer Assessment - Initial Assessment Questions 1. DESCRIPTION: "Describe your dizziness."    Intermittent; last episode Monday 08/28/17 she had to sit down 2. LIGHTHEADED: "Do you feel lightheaded?" (e.g., somewhat faint, woozy, weak upon standing)     lighthead 3. VERTIGO: "Do you feel like either you or the room is spinning or tilting?" (i.e. vertigo)  no 4. SEVERITY: "How bad is it?"  "Do you feel like you are going to faint?" "Can you stand and walk?"   - MILD - walking normally   - MODERATE - interferes with normal activities (e.g., work, school)    - SEVERE - unable to stand, requires support to walk, feels like passing out now.      moderate 5. ONSET:  "When did the dizziness begin?"     Saturday 08/26/17 6. AGGRAVATING FACTORS: "Does anything make it worse?" (e.g., standing, change in head position)     lights 7.  HEART RATE: "Can you tell me your heart rate?" "How many beats in 15 seconds?"  (Note: not all patients can do this)       unsure 8. CAUSE: "What do you think is causing the dizziness?"     Unsure thought was sinus 9. RECURRENT SYMPTOM: "Have you had dizziness before?" If so, ask: "When was the last time?" "What happened that time?"     Yes "when my blood was really low" 10. OTHER SYMPTOMS: "Do you have any other symptoms?" (e.g., fever, chest pain, vomiting, diarrhea, bleeding)      no 11. PREGNANCY: "Is there any chance you are pregnant?" "When was your last menstrual period?"       yes LM 08/25/17  Answer Assessment - Initial Assessment Questions 1. SYMPTOM: "What is the main symptom you are concerned about?" (e.g., weakness, numbness)     numbtiness and tingling in right hand 2. ONSET: "When did this start?" (minutes, hours, days; while sleeping)     hours 3. LAST NORMAL: "When was the last time you were normal (no symptoms)?"     09/04/17 at 2245 4. PATTERN "Does this come and go, or has it been constant since it started?"  "Is it present now?"     Constant since 0500 this morning 5. CARDIAC SYMPTOMS: "Have you had any of the following symptoms: chest pain, difficulty breathing, palpitations?"  palpatations 6. NEUROLOGIC SYMPTOMS: "Have you had any of the following symptoms: headache, dizziness, vision loss, double vision, changes in speech, unsteady on your feet?"     no 7. OTHER SYMPTOMS: "Do you have any other symptoms?"     no 8. PREGNANCY: "Is there any chance you are pregnant?" "When was your last menstrual period?"  LMP 08/25/17  Protocols used: NEUROLOGIC DEFICIT-A-AH, DIZZINESS - [facility]

## 2017-09-06 NOTE — Telephone Encounter (Signed)
Left message for patient to return call.

## 2017-09-11 ENCOUNTER — Ambulatory Visit: Payer: BLUE CROSS/BLUE SHIELD | Admitting: Internal Medicine

## 2017-09-11 ENCOUNTER — Encounter: Payer: Self-pay | Admitting: Internal Medicine

## 2017-09-11 VITALS — BP 98/70 | HR 58 | Temp 98.7°F | Ht 68.0 in | Wt 211.2 lb

## 2017-09-11 DIAGNOSIS — I10 Essential (primary) hypertension: Secondary | ICD-10-CM | POA: Diagnosis not present

## 2017-09-11 DIAGNOSIS — J329 Chronic sinusitis, unspecified: Secondary | ICD-10-CM

## 2017-09-11 DIAGNOSIS — R7303 Prediabetes: Secondary | ICD-10-CM

## 2017-09-11 DIAGNOSIS — Z113 Encounter for screening for infections with a predominantly sexual mode of transmission: Secondary | ICD-10-CM | POA: Diagnosis not present

## 2017-09-11 DIAGNOSIS — E611 Iron deficiency: Secondary | ICD-10-CM | POA: Diagnosis not present

## 2017-09-11 DIAGNOSIS — R200 Anesthesia of skin: Secondary | ICD-10-CM | POA: Diagnosis not present

## 2017-09-11 DIAGNOSIS — Z1329 Encounter for screening for other suspected endocrine disorder: Secondary | ICD-10-CM | POA: Diagnosis not present

## 2017-09-11 DIAGNOSIS — Z13818 Encounter for screening for other digestive system disorders: Secondary | ICD-10-CM | POA: Diagnosis not present

## 2017-09-11 DIAGNOSIS — R202 Paresthesia of skin: Secondary | ICD-10-CM | POA: Diagnosis not present

## 2017-09-11 LAB — CBC WITH DIFFERENTIAL/PLATELET
BASOS PCT: 0.7 % (ref 0.0–3.0)
Basophils Absolute: 0 10*3/uL (ref 0.0–0.1)
Eosinophils Absolute: 0 10*3/uL (ref 0.0–0.7)
Eosinophils Relative: 0.7 % (ref 0.0–5.0)
HCT: 41.4 % (ref 36.0–46.0)
HEMOGLOBIN: 13.6 g/dL (ref 12.0–15.0)
LYMPHS ABS: 2.5 10*3/uL (ref 0.7–4.0)
Lymphocytes Relative: 43.1 % (ref 12.0–46.0)
MCHC: 32.9 g/dL (ref 30.0–36.0)
MCV: 84.3 fl (ref 78.0–100.0)
MONOS PCT: 10.2 % (ref 3.0–12.0)
Monocytes Absolute: 0.6 10*3/uL (ref 0.1–1.0)
NEUTROS PCT: 45.3 % (ref 43.0–77.0)
Neutro Abs: 2.6 10*3/uL (ref 1.4–7.7)
Platelets: 233 10*3/uL (ref 150.0–400.0)
RBC: 4.91 Mil/uL (ref 3.87–5.11)
RDW: 13.6 % (ref 11.5–15.5)
WBC: 5.7 10*3/uL (ref 4.0–10.5)

## 2017-09-11 LAB — T4, FREE: FREE T4: 0.6 ng/dL (ref 0.60–1.60)

## 2017-09-11 LAB — COMPREHENSIVE METABOLIC PANEL
ALBUMIN: 4.3 g/dL (ref 3.5–5.2)
ALT: 25 U/L (ref 0–35)
AST: 24 U/L (ref 0–37)
Alkaline Phosphatase: 44 U/L (ref 39–117)
BILIRUBIN TOTAL: 0.4 mg/dL (ref 0.2–1.2)
BUN: 8 mg/dL (ref 6–23)
CO2: 30 meq/L (ref 19–32)
CREATININE: 0.95 mg/dL (ref 0.40–1.20)
Calcium: 10.1 mg/dL (ref 8.4–10.5)
Chloride: 100 mEq/L (ref 96–112)
GFR: 80.32 mL/min (ref >60.00)
Glucose, Bld: 89 mg/dL (ref 70–99)
Potassium: 3.8 mEq/L (ref 3.5–5.1)
SODIUM: 138 meq/L (ref 135–145)
Total Protein: 7.5 g/dL (ref 6.0–8.3)

## 2017-09-11 LAB — URINALYSIS, ROUTINE W REFLEX MICROSCOPIC
BILIRUBIN URINE: NEGATIVE
HGB URINE DIPSTICK: NEGATIVE
Ketones, ur: NEGATIVE
LEUKOCYTES UA: NEGATIVE
NITRITE: NEGATIVE
RBC / HPF: NONE SEEN (ref 0–?)
Specific Gravity, Urine: 1.02 (ref 1.000–1.030)
Total Protein, Urine: NEGATIVE
Urine Glucose: NEGATIVE
Urobilinogen, UA: 0.2 (ref 0.0–1.0)
pH: 6 (ref 5.0–8.0)

## 2017-09-11 LAB — HEMOGLOBIN A1C: Hgb A1c MFr Bld: 6.1 % (ref 4.6–6.5)

## 2017-09-11 LAB — TSH: TSH: 2.13 u[IU]/mL (ref 0.35–4.50)

## 2017-09-11 MED ORDER — LISINOPRIL-HYDROCHLOROTHIAZIDE 10-12.5 MG PO TABS: 1.0000 | ORAL_TABLET | Freq: Every day | ORAL | 3 refills | Status: DC

## 2017-09-11 MED ORDER — SALINE SPRAY 0.65 % NA SOLN: 2.0000 | NASAL | 0 refills | Status: DC | PRN

## 2017-09-11 MED ORDER — AMOXICILLIN-POT CLAVULANATE 875-125 MG PO TABS: 1.0000 | ORAL_TABLET | Freq: Two times a day (BID) | ORAL | 0 refills | Status: DC

## 2017-09-11 MED ORDER — FLUTICASONE PROPIONATE 50 MCG/ACT NA SUSP: 1.0000 | Freq: Every day | NASAL | 2 refills | Status: DC

## 2017-09-11 NOTE — Progress Notes (Signed)
Pre visit review using our clinic review tool, if applicable. No additional management support is needed unless otherwise documented below in the visit note. 

## 2017-09-11 NOTE — Progress Notes (Signed)
Chief Complaint  Patient presents with  . Follow-up   F/u  1. C/o sinus issues x 2 weeks tired Sinutab qhs due to congestion at night. She also has expired OTC nasal spray not sure if working  2. C/o right fingers 1st-3rd numbness and tingling and prev when this happened she had this was anemic and needed blood transfusion. Denies neck pain  3. Reports feeling lightheaded on lis 20-12.5 with h/o HTN will reduce dose of the medication to 10-12.5 mg qd.      Review of Systems  Constitutional: Negative for weight loss.  HENT: Positive for congestion and sinus pain.   Eyes: Negative for blurred vision.  Respiratory: Negative for shortness of breath.   Cardiovascular: Negative for chest pain.  Gastrointestinal: Negative for abdominal pain.  Musculoskeletal: Negative for neck pain.  Skin: Negative for rash.  Neurological:       +numbness/tingling right 1st-3rd fingers   Psychiatric/Behavioral: Negative for memory loss.   Past Medical History:  Diagnosis Date  . Genital herpes   . Genital warts   . Hypertension   . Migraine    Past Surgical History:  Procedure Laterality Date  . CHOLECYSTECTOMY    . ECTOPIC PREGNANCY SURGERY Left   . EMBOLIZATION     For fibroid   Family History  Problem Relation Age of Onset  . Arthritis Mother   . Diabetes Mother   . Diabetes Father   . Cancer Brother   . Cancer Maternal Grandfather   . Breast cancer Maternal Aunt   . Ovarian cancer Maternal Aunt   . Colon cancer Neg Hx    Social History   Socioeconomic History  . Marital status: Single    Spouse name: Not on file  . Number of children: Not on file  . Years of education: Not on file  . Highest education level: Not on file  Social Needs  . Financial resource strain: Not on file  . Food insecurity - worry: Not on file  . Food insecurity - inability: Not on file  . Transportation needs - medical: Not on file  . Transportation needs - non-medical: Not on file  Occupational  History  . Not on file  Tobacco Use  . Smoking status: Former Smoker    Last attempt to quit: 2007    Years since quitting: 12.1  . Smokeless tobacco: Never Used  Substance and Sexual Activity  . Alcohol use: Yes    Alcohol/week: 1.2 oz    Types: 2 Glasses of wine per week    Comment: per 2 weeks   . Drug use: No  . Sexual activity: Yes    Birth control/protection: Condom  Other Topics Concern  . Not on file  Social History Narrative  . Not on file   Current Meds  Medication Sig  . amitriptyline (ELAVIL) 10 MG tablet Take 10 mg by mouth at bedtime.   . valACYclovir (VALTREX) 500 MG tablet Take 1 tablet (500 mg total) by mouth 2 (two) times daily as needed.  . [DISCONTINUED] lisinopril-hydrochlorothiazide (PRINZIDE,ZESTORETIC) 20-12.5 MG tablet Take 1 tablet by mouth daily.   No Known Allergies Recent Results (from the past 2160 hour(s))  IGP, [id number]/18     Status: None   Collection Time: 07/03/17  3:01 PM  Result Value Ref Range   DIAGNOSIS: Comment     Comment: NEGATIVE FOR INTRAEPITHELIAL LESION AND MALIGNANCY.   Specimen adequacy: Comment     Comment: Satisfactory for evaluation. Endocervical and/or squamous metaplastic  cells (endocervical component) are present.    Clinician Provided ICD10 Comment     Comment: Z12.4   Performed by: Comment     Comment: Diannia Ruder, Supervisory Cytotechnologist (ASCP)   PAP Smear Comment .    Note: Comment     Comment: The Pap smear is a screening test designed to aid in the detection of premalignant and malignant conditions of the uterine cervix.  It is not a diagnostic procedure and should not be used as the sole means of detecting cervical cancer.  Both false-positive and false-negative reports do occur.    Test Methodology Comment     Comment: This liquid based ThinPrep(R) pap test was screened with the use of an image guided system.    HPV other hr types Negative Negative   HPV 16 Negative Negative   HPV 18 Negative  Negative    Comment: This test detects fourteen high-risk HPV types: HPV16, HPV18 and twelve other high-risk types (31, 33, 35, 39, 45, 51, 52, 56, 58, 59, 66, 68) without differentiation.    Objective  Body mass index is 32.11 kg/m. Wt Readings from Last 3 Encounters:  09/11/17 211 lb 3.2 oz (95.8 kg)  08/24/17 209 lb 14.4 oz (95.2 kg)  07/03/17 212 lb 6.4 oz (96.3 kg)   Temp Readings from Last 3 Encounters:  09/11/17 98.7 F (37.1 C) (Oral)  12/07/16 98.3 F (36.8 C) (Oral)   BP Readings from Last 3 Encounters:  09/11/17 98/70  08/24/17 119/80  07/03/17 105/69   Pulse Readings from Last 3 Encounters:  09/11/17 (!) 58  08/24/17 75  07/03/17 72   o2 sat room air 97%  Physical Exam  Constitutional: She is oriented to person, place, and time and well-developed, well-nourished, and in no distress. Vital signs are normal.  HENT:  Head: Normocephalic and atraumatic.  Mouth/Throat: Oropharynx is clear and moist and mucous membranes are normal.  Eyes: Conjunctivae are normal. Pupils are equal, round, and reactive to light.  Cardiovascular: Normal rate, regular rhythm and normal heart sounds.  Pulmonary/Chest: Effort normal and breath sounds normal.  Musculoskeletal:  Neg tinels right wrist   Neurological: She is alert and oriented to person, place, and time. Gait normal. Gait normal.  Skin: Skin is warm, dry and intact.  Psychiatric: Mood, memory, affect and judgment normal.  Nursing note and vitals reviewed.   Assessment   1. Sinusitis  2. Right 1st-3rd fingers numbness tingling ? Related CTS 3. H/o HTN but bp low normal today  4. HM Plan  1. Augmentin, nasal saline, flonase  2. Try brace if does not help and b/w neg for anemia as she stated this caused sx's last time will refer NCS/EMG  3. Reduce lis 20-12.5 to 10-12.5 and f/u in 2 weeks  Check CMET, CBC, A1C, UA, anemia panel, TSH, T4, HIV today  Lipid will need to check in future  4.  Had flu shot 03/31/17   Tdap had  Check hep B status today  Pap neg 07/05/17 neg hpv  mammo neg 01/23/17  Colonoscopy due age 4 y.o   Former smoker quit 24-Oct-2005 smoked from age 29 to 10-24-05 1 ppd FH brother died lung cancer     Provider: Dr. Olivia Mackie McLean-Scocuzza-Internal Medicine

## 2017-09-11 NOTE — Patient Instructions (Addendum)
Please use Nasal saline 1-2 sprays each nose 1x per day and Flonase after max 2 sprays each nose  Labs today  F/u in 2 weeks  Please use brace to right wrist during the day  Take care     Carpal Tunnel Syndrome Carpal tunnel syndrome is a condition that causes pain in your hand and arm. The carpal tunnel is a narrow area located on the palm side of your wrist. Repeated wrist motion or certain diseases may cause swelling within the tunnel. This swelling pinches the main nerve in the wrist (median nerve). What are the causes? This condition may be caused by:  Repeated wrist motions.  Wrist injuries.  Arthritis.  A cyst or tumor in the carpal tunnel.  Fluid buildup during pregnancy.  Sometimes the cause of this condition is not known. What increases the risk? This condition is more likely to develop in:  People who have jobs that cause them to repeatedly move their wrists in the same motion, such as Art gallery manager.  Women.  People with certain conditions, such as: ? Diabetes. ? Obesity. ? An underactive thyroid (hypothyroidism). ? Kidney failure.  What are the signs or symptoms? Symptoms of this condition include:  A tingling feeling in your fingers, especially in your thumb, index, and middle fingers.  Tingling or numbness in your hand.  An aching feeling in your entire arm, especially when your wrist and elbow are bent for long periods of time.  Wrist pain that goes up your arm to your shoulder.  Pain that goes down into your palm or fingers.  A weak feeling in your hands. You may have trouble grabbing and holding items.  Your symptoms may feel worse during the night. How is this diagnosed? This condition is diagnosed with a medical history and physical exam. You may also have tests, including:  An electromyogram (EMG). This test measures electrical signals sent by your nerves into the muscles.  X-rays.  How is this treated? Treatment for this  condition includes:  Lifestyle changes. It is important to stop doing or modify the activity that caused your condition.  Physical or occupational therapy.  Medicines for pain and inflammation. This may include medicine that is injected into your wrist.  A wrist splint.  Surgery.  Follow these instructions at home: If you have a splint:  Wear it as told by your health care provider. Remove it only as told by your health care provider.  Loosen the splint if your fingers become numb and tingle, or if they turn cold and blue.  Keep the splint clean and dry. General instructions  Take over-the-counter and prescription medicines only as told by your health care provider.  Rest your wrist from any activity that may be causing your pain. If your condition is work related, talk to your employer about changes that can be made, such as getting a wrist pad to use while typing.  If directed, apply ice to the painful area: ? Put ice in a plastic bag. ? Place a towel between your skin and the bag. ? Leave the ice on for 20 minutes, 2-3 times per day.  Keep all follow-up visits as told by your health care provider. This is important.  Do any exercises as told by your health care provider, physical therapist, or occupational therapist. Contact a health care provider if:  You have new symptoms.  Your pain is not controlled with medicines.  Your symptoms get worse. This information is not intended  to replace advice given to you by your health care provider. Make sure you discuss any questions you have with your health care provider. Document Released: 07/01/2000 Document Revised: 11/12/2015 Document Reviewed: 11/19/2014 Elsevier Interactive Patient Education  2018 [employer].  Sinusitis, Adult Sinusitis is soreness and inflammation of your sinuses. Sinuses are hollow spaces in the bones around your face. Your sinuses are located:  Around your eyes.  In the middle of your  forehead.  Behind your nose.  In your cheekbones.  Your sinuses and nasal passages are lined with a stringy fluid (mucus). Mucus normally drains out of your sinuses. When your nasal tissues become inflamed or swollen, the mucus can become trapped or blocked so air cannot flow through your sinuses. This allows bacteria, viruses, and funguses to grow, which leads to infection. Sinusitis can develop quickly and last for 7?10 days (acute) or for more than 12 weeks (chronic). Sinusitis often develops after a cold. What are the causes? This condition is caused by anything that creates swelling in the sinuses or stops mucus from draining, including:  Allergies.  Asthma.  Bacterial or viral infection.  Abnormally shaped bones between the nasal passages.  Nasal growths that contain mucus (nasal polyps).  Narrow sinus openings.  Pollutants, such as chemicals or irritants in the air.  A foreign object stuck in the nose.  A fungal infection. This is rare.  What increases the risk? The following factors may make you more likely to develop this condition:  Having allergies or asthma.  Having had a recent cold or respiratory tract infection.  Having structural deformities or blockages in your nose or sinuses.  Having a weak immune system.  Doing a lot of swimming or diving.  Overusing nasal sprays.  Smoking.  What are the signs or symptoms? The main symptoms of this condition are pain and a feeling of pressure around the affected sinuses. Other symptoms include:  Upper toothache.  Earache.  Headache.  Bad breath.  Decreased sense of smell and taste.  A cough that may get worse at night.  Fatigue.  Fever.  Thick drainage from your nose. The drainage is often green and it may contain pus (purulent).  Stuffy nose or congestion.  Postnasal drip. This is when extra mucus collects in the throat or back of the nose.  Swelling and warmth over the affected  sinuses.  Sore throat.  Sensitivity to light.  How is this diagnosed? This condition is diagnosed based on symptoms, a medical history, and a physical exam. To find out if your condition is acute or chronic, your health care provider may:  Look in your nose for signs of nasal polyps.  Tap over the affected sinus to check for signs of infection.  View the inside of your sinuses using an imaging device that has a light attached (endoscope).  If your health care provider suspects that you have chronic sinusitis, you may also:  Be tested for allergies.  Have a sample of mucus taken from your nose (nasal culture) and checked for bacteria.  Have a mucus sample examined to see if your sinusitis is related to an allergy.  If your sinusitis does not respond to treatment and it lasts longer than 8 weeks, you may have an MRI or CT scan to check your sinuses. These scans also help to determine how severe your infection is. In rare cases, a bone biopsy may be done to rule out more serious types of fungal sinus disease. How is this  treated? Treatment for sinusitis depends on the cause and whether your condition is chronic or acute. If a virus is causing your sinusitis, your symptoms will go away on their own within 10 days. You may be given medicines to relieve your symptoms, including:  Topical nasal decongestants. They shrink swollen nasal passages and let mucus drain from your sinuses.  Antihistamines. These drugs block inflammation that is triggered by allergies. This can help to ease swelling in your nose and sinuses.  Topical nasal corticosteroids. These are nasal sprays that ease inflammation and swelling in your nose and sinuses.  Nasal saline washes. These rinses can help to get rid of thick mucus in your nose.  If your condition is caused by bacteria, you will be given an antibiotic medicine. If your condition is caused by a fungus, you will be given an antifungal medicine. Surgery  may be needed to correct underlying conditions, such as narrow nasal passages. Surgery may also be needed to remove polyps. Follow these instructions at home: Medicines  Take, use, or apply over-the-counter and prescription medicines only as told by your health care provider. These may include nasal sprays.  If you were prescribed an antibiotic medicine, take it as told by your health care provider. Do not stop taking the antibiotic even if you start to feel better. Hydrate and Humidify  Drink enough water to keep your urine clear or pale yellow. Staying hydrated will help to thin your mucus.  Use a cool mist humidifier to keep the humidity level in your home above 50%.  Inhale steam for 10-15 minutes, 3-4 times a day or as told by your health care provider. You can do this in the bathroom while a hot shower is running.  Limit your exposure to cool or dry air. Rest  Rest as much as possible.  Sleep with your head raised (elevated).  Make sure to get enough sleep each night. General instructions  Apply a warm, moist washcloth to your face 3-4 times a day or as told by your health care provider. This will help with discomfort.  Wash your hands often with soap and water to reduce your exposure to viruses and other germs. If soap and water are not available, use hand sanitizer.  Do not smoke. Avoid being around people who are smoking (secondhand smoke).  Keep all follow-up visits as told by your health care provider. This is important. Contact a health care provider if:  You have a fever.  Your symptoms get worse.  Your symptoms do not improve within 10 days. Get help right away if:  You have a severe headache.  You have persistent vomiting.  You have pain or swelling around your face or eyes.  You have vision problems.  You develop confusion.  Your neck is stiff.  You have trouble breathing. This information is not intended to replace advice given to you by your  health care provider. Make sure you discuss any questions you have with your health care provider. Document Released: 07/04/2005 Document Revised: 02/28/2016 Document Reviewed: 04/29/2015 Elsevier Interactive Patient Education  [employer].

## 2017-09-12 LAB — IRON,TIBC AND FERRITIN PANEL
%SAT: 31 % (calc) (ref 11–50)
Ferritin: 74 ng/mL (ref 10–232)
Iron: 107 ug/dL (ref 40–190)
TIBC: 340 ug/dL (ref 250–450)

## 2017-09-12 LAB — HEPATITIS B SURFACE ANTIBODY, QUANTITATIVE: Hepatitis B-Post: <5 m[IU]/mL — ABNORMAL LOW (ref >=10)

## 2017-09-12 LAB — HIV ANTIBODY (ROUTINE TESTING W REFLEX): HIV: NONREACTIVE

## 2017-10-13 ENCOUNTER — Ambulatory Visit: Payer: BLUE CROSS/BLUE SHIELD | Admitting: Internal Medicine

## 2017-10-20 ENCOUNTER — Encounter: Payer: Self-pay | Admitting: Internal Medicine

## 2017-10-20 ENCOUNTER — Ambulatory Visit: Payer: BLUE CROSS/BLUE SHIELD | Admitting: Internal Medicine

## 2017-10-20 VITALS — BP 110/64 | HR 76 | Temp 98.2°F | Ht 68.0 in | Wt 214.6 lb

## 2017-10-20 DIAGNOSIS — Z23 Encounter for immunization: Secondary | ICD-10-CM | POA: Diagnosis not present

## 2017-10-20 DIAGNOSIS — J322 Chronic ethmoidal sinusitis: Secondary | ICD-10-CM | POA: Diagnosis not present

## 2017-10-20 DIAGNOSIS — I10 Essential (primary) hypertension: Secondary | ICD-10-CM

## 2017-10-20 DIAGNOSIS — G4700 Insomnia, unspecified: Secondary | ICD-10-CM

## 2017-10-20 DIAGNOSIS — R5383 Other fatigue: Secondary | ICD-10-CM | POA: Diagnosis not present

## 2017-10-20 DIAGNOSIS — Z1231 Encounter for screening mammogram for malignant neoplasm of breast: Secondary | ICD-10-CM

## 2017-10-20 HISTORY — DX: Other fatigue: R53.83

## 2017-10-20 HISTORY — DX: Insomnia, unspecified: G47.00

## 2017-10-20 MED ORDER — DOXYCYCLINE HYCLATE 100 MG PO TABS: 100.0000 mg | ORAL_TABLET | Freq: Two times a day (BID) | ORAL | 0 refills | Status: DC

## 2017-10-20 NOTE — Progress Notes (Signed)
Chief Complaint  Patient presents with  . Follow-up   F/u  1. HTN controlled on Lis 10-hct 12.5  2. Still c/o sinus pain and green discharge in the am and voice changing after 1 week Augmentin  3. C/o fatigue though only sleeping 4-5 hrs qhs due to work sch and trying to start exercising  4. Reviewed labs agreeable to hep B vaccine today and start series. Next 11/19/17 then 04/21/18   Review of Systems  Constitutional: Positive for malaise/fatigue. Negative for weight loss.  HENT: Positive for sinus pain.   Eyes: Negative for blurred vision.  Respiratory: Negative for shortness of breath.   Cardiovascular: Negative for chest pain.  Skin: Negative for rash.   Past Medical History:  Diagnosis Date  . Genital herpes   . Genital warts   . Hypertension   . Migraine    Past Surgical History:  Procedure Laterality Date  . CHOLECYSTECTOMY    . ECTOPIC PREGNANCY SURGERY Left   . EMBOLIZATION     For fibroid   Family History  Problem Relation Age of Onset  . Arthritis Mother   . Diabetes Mother   . Diabetes Father   . Cancer Brother   . Cancer Maternal Grandfather   . Breast cancer Maternal Aunt   . Ovarian cancer Maternal Aunt   . Colon cancer Neg Hx    Social History   Socioeconomic History  . Marital status: Single    Spouse name: Not on file  . Number of children: Not on file  . Years of education: Not on file  . Highest education level: Not on file  Occupational History  . Not on file  Social Needs  . Financial resource strain: Not on file  . Food insecurity:    Worry: Not on file    Inability: Not on file  . Transportation needs:    Medical: Not on file    Non-medical: Not on file  Tobacco Use  . Smoking status: Former Smoker    Last attempt to quit: 2007    Years since quitting: 12.2  . Smokeless tobacco: Never Used  Substance and Sexual Activity  . Alcohol use: Yes    Alcohol/week: 1.2 oz    Types: 2 Glasses of wine per week    Comment: per 2 weeks    . Drug use: No  . Sexual activity: Yes    Birth control/protection: Condom  Lifestyle  . Physical activity:    Days per week: Not on file    Minutes per session: Not on file  . Stress: Not on file  Relationships  . Social connections:    Talks on phone: Not on file    Gets together: Not on file    Attends religious service: Not on file    Active member of club or organization: Not on file    Attends meetings of clubs or organizations: Not on file    Relationship status: Not on file  . Intimate partner violence:    Fear of current or ex partner: Not on file    Emotionally abused: Not on file    Physically abused: Not on file    Forced sexual activity: Not on file  Other Topics Concern  . Not on file  Social History Narrative  . Not on file   Current Meds  Medication Sig  . amitriptyline (ELAVIL) 10 MG tablet Take 10 mg by mouth at bedtime.   . fluticasone (FLONASE) 50 MCG/ACT nasal spray Place  1-2 sprays into both nostrils daily. 1x per day  . lisinopril-hydrochlorothiazide (PRINZIDE,ZESTORETIC) 10-12.5 MG tablet Take 1 tablet by mouth daily.  . sodium chloride (OCEAN) 0.65 % SOLN nasal spray Place 2 sprays into both nostrils as needed for congestion.  . valACYclovir (VALTREX) 500 MG tablet Take 1 tablet (500 mg total) by mouth 2 (two) times daily as needed.  . [DISCONTINUED] amoxicillin-clavulanate (AUGMENTIN) 875-125 MG tablet Take 1 tablet by mouth 2 (two) times daily.   No Known Allergies Recent Results (from the past 2160 hour(s))  Comprehensive metabolic panel     Status: None   Collection Time: 09/11/17  2:59 PM  Result Value Ref Range   Sodium 138 135 - 145 mEq/L   Potassium 3.8 3.5 - 5.1 mEq/L   Chloride 100 96 - 112 mEq/L   CO2 30 19 - 32 mEq/L   Glucose, Bld 89 70 - 99 mg/dL   BUN 8 6 - 23 mg/dL   Creatinine, Ser 0.95 0.40 - 1.20 mg/dL   Total Bilirubin 0.4 0.2 - 1.2 mg/dL   Alkaline Phosphatase 44 39 - 117 U/L   AST 24 0 - 37 U/L   ALT 25 0 - 35 U/L    Total Protein 7.5 6.0 - 8.3 g/dL   Albumin 4.3 3.5 - 5.2 g/dL   Calcium 10.1 8.4 - 10.5 mg/dL   GFR 80.32 >60.00 mL/min  CBC w/Diff     Status: None   Collection Time: 09/11/17  2:59 PM  Result Value Ref Range   WBC 5.7 4.0 - 10.5 K/uL   RBC 4.91 3.87 - 5.11 Mil/uL   Hemoglobin 13.6 12.0 - 15.0 g/dL   HCT 41.4 36.0 - 46.0 %   MCV 84.3 78.0 - 100.0 fl   MCHC 32.9 30.0 - 36.0 g/dL   RDW 13.6 11.5 - 15.5 %   Platelets 233.0 150.0 - 400.0 K/uL   Neutrophils Relative % 45.3 43.0 - 77.0 %   Lymphocytes Relative 43.1 12.0 - 46.0 %   Monocytes Relative 10.2 3.0 - 12.0 %   Eosinophils Relative 0.7 0.0 - 5.0 %   Basophils Relative 0.7 0.0 - 3.0 %   Neutro Abs 2.6 1.4 - 7.7 K/uL   Lymphs Abs 2.5 0.7 - 4.0 K/uL   Monocytes Absolute 0.6 0.1 - 1.0 K/uL   Eosinophils Absolute 0.0 0.0 - 0.7 K/uL   Basophils Absolute 0.0 0.0 - 0.1 K/uL  Hemoglobin A1c     Status: None   Collection Time: 09/11/17  2:59 PM  Result Value Ref Range   Hgb A1c MFr Bld 6.1 4.6 - 6.5 %    Comment: Glycemic Control Guidelines for People with Diabetes:Non Diabetic:  <6%Goal of Therapy: <7%Additional Action Suggested:  >8%   Urinalysis, Routine w reflex microscopic     Status: None   Collection Time: 09/11/17  2:59 PM  Result Value Ref Range   Color, Urine YELLOW Yellow;Lt. Yellow   APPearance CLEAR Clear   Specific Gravity, Urine 1.020 1.000 - 1.030   pH 6.0 5.0 - 8.0   Total Protein, Urine NEGATIVE Negative   Urine Glucose NEGATIVE Negative   Ketones, ur NEGATIVE Negative   Bilirubin Urine NEGATIVE Negative   Hgb urine dipstick NEGATIVE Negative   Urobilinogen, UA 0.2 0.0 - 1.0   Leukocytes, UA NEGATIVE Negative   Nitrite NEGATIVE Negative   WBC, UA 0-2/hpf 0-2/hpf   RBC / HPF none seen 0-2/hpf   Squamous Epithelial / LPF Rare(0-4/hpf) Rare(0-4/hpf)  TSH  Status: None   Collection Time: 09/11/17  2:59 PM  Result Value Ref Range   TSH 2.13 0.35 - 4.50 uIU/mL  T4, free     Status: None   Collection Time:  09/11/17  2:59 PM  Result Value Ref Range   Free T4 0.60 0.60 - 1.60 ng/dL    Comment: Specimens from patients who are undergoing biotin therapy and /or ingesting biotin supplements may contain high levels of biotin.  The higher biotin concentration in these specimens interferes with this Free T4 assay.  Specimens that contain high levels  of biotin may cause false high results for this Free T4 assay.  Please interpret results in light of the total clinical presentation of the patient.    Iron, TIBC and Ferritin Panel     Status: None   Collection Time: 09/11/17  2:59 PM  Result Value Ref Range   Iron 107 40 - 190 mcg/dL   TIBC 340 250 - 450 mcg/dL (calc)   %SAT 31 11 - 50 % (calc)   Ferritin 74 10 - 232 ng/mL  HIV antibody (with reflex)     Status: None   Collection Time: 09/11/17  2:59 PM  Result Value Ref Range   HIV 1&2 Ab, 4th Generation NON-REACTIVE NON-REACTI    Comment: HIV-1 antigen and HIV-1/HIV-2 antibodies were not detected. There is no laboratory evidence of HIV infection. Marland Kitchen PLEASE NOTE: This information has been disclosed to you from records whose confidentiality may be protected by state law.  If your state requires such protection, then the state law prohibits you from making any further disclosure of the information without the specific written consent of the person to whom it pertains, or as otherwise permitted by law. A general authorization for the release of medical or other information is NOT sufficient for this purpose. . For additional information please refer to http://education.[facility].com/faq/FAQ106 (This link is being provided for informational/ educational purposes only.) . Marland Kitchen The performance of this assay has not been clinically validated in patients less than 38 years old. .   Hepatitis B surface antibody     Status: Abnormal   Collection Time: 09/11/17  3:05 PM  Result Value Ref Range   Hepatitis B-Post <5 (L) > OR = 10 mIU/mL     Comment: . Patient does not have immunity to hepatitis B virus. . For additional information, please refer to http://education.[facility].com/faq/FAQ105 (This link is being provided for informational/ educational purposes only).    Objective  Body mass index is 32.63 kg/m. Wt Readings from Last 3 Encounters:  10/20/17 214 lb 9.6 oz (97.3 kg)  09/11/17 211 lb 3.2 oz (95.8 kg)  08/24/17 209 lb 14.4 oz (95.2 kg)   Temp Readings from Last 3 Encounters:  10/20/17 98.2 F (36.8 C) (Oral)  09/11/17 98.7 F (37.1 C) (Oral)  12/07/16 98.3 F (36.8 C) (Oral)   BP Readings from Last 3 Encounters:  10/20/17 110/64  09/11/17 98/70  08/24/17 119/80   Pulse Readings from Last 3 Encounters:  10/20/17 76  09/11/17 (!) 58  08/24/17 75    Physical Exam  Constitutional: She is oriented to person, place, and time and well-developed, well-nourished, and in no distress. Vital signs are normal.  HENT:  Head: Normocephalic and atraumatic.  Nose: Right sinus exhibits maxillary sinus tenderness. Left sinus exhibits maxillary sinus tenderness.  Mouth/Throat: Oropharynx is clear and moist and mucous membranes are normal.  +ethomid ttp   Eyes: Pupils are equal, round, and reactive to light.  Conjunctivae are normal.  Cardiovascular: Normal rate, regular rhythm and normal heart sounds.  Pulmonary/Chest: Effort normal and breath sounds normal.  Neurological: She is alert and oriented to person, place, and time. Gait normal. Gait normal.  Skin: Skin is warm, dry and intact.  Psychiatric: Mood, memory, affect and judgment normal.  Nursing note and vitals reviewed.   Assessment   1. HTN 2. Sinusitis  3. Fatigue likely 2/2 lack of sleep  4. HM Plan  1. Cont meds  2. Doxy bid x 1 week if not improved do CT sinus call back in 1-2 weeks 3. Reviewed labs  rec sleep hygiene  4.  Had flu shot 03/31/17  Tdap had  Hep B given today next 11/19/17 and 04/21/18  Pap neg 07/05/17 neg hpv Dr.  Enzo Bi mammo neg 01/23/17 referred today for 01/2018 ordered  Colonoscopy due age 58 y.o   Pt to sch fasting lipid   Former smoker quit 10/26/2005 smoked from age 26 to 28-Oct-2005 1 ppd FH brother died lung cancer     Provider: Dr. Olivia Mackie McLean-Scocuzza-[facility]

## 2017-10-20 NOTE — Patient Instructions (Signed)
My chart me in 1 week if not better will do CT sinuses   Hepatitis B Vaccine, Recombinant injection What is this medicine? HEPATITIS B VACCINE (hep uh TAHY tis B VAK seen) is a vaccine. It is used to prevent an infection with the hepatitis B virus. This medicine may be used for other purposes; ask your health care provider or pharmacist if you have questions. COMMON BRAND NAME(S): Engerix-B, Recombivax HB What should I tell my health care provider before I take this medicine? They need to know if you have any of these conditions: -fever, infection -heart disease -hepatitis B infection -immune system problems -kidney disease -an unusual or allergic reaction to vaccines, yeast, other medicines, foods, dyes, or preservatives -pregnant or trying to get pregnant -breast-feeding How should I use this medicine? This vaccine is for injection into a muscle. It is given by a health care professional. A copy of Vaccine Information Statements will be given before each vaccination. Read this sheet carefully each time. The sheet may change frequently. Talk to your pediatrician regarding the use of this medicine in children. While this drug may be prescribed for children as young as newborn for selected conditions, precautions do apply. Overdosage: If you think you have taken too much of this medicine contact a poison control center or emergency room at once. NOTE: This medicine is only for you. Do not share this medicine with others. What if I miss a dose? It is important not to miss your dose. Call your doctor or health care professional if you are unable to keep an appointment. What may interact with this medicine? -medicines that suppress your immune function like adalimumab, anakinra, infliximab -medicines to treat cancer -steroid medicines like prednisone or cortisone This list may not describe all possible interactions. Give your health care provider a list of all the medicines, herbs,  non-prescription drugs, or dietary supplements you use. Also tell them if you smoke, drink alcohol, or use illegal drugs. Some items may interact with your medicine. What should I watch for while using this medicine? See your health care provider for all shots of this vaccine as directed. You must have 3 shots of this vaccine for protection from hepatitis B infection. Tell your doctor right away if you have any serious or unusual side effects after getting this vaccine. What side effects may I notice from receiving this medicine? Side effects that you should report to your doctor or health care professional as soon as possible: -allergic reactions like skin rash, itching or hives, swelling of the face, lips, or tongue -breathing problems -confused, irritated -fast, irregular heartbeat -flu-like syndrome -numb, tingling pain -seizures -unusually weak or tired Side effects that usually do not require medical attention (report to your doctor or health care professional if they continue or are bothersome): -diarrhea -fever -headache -loss of appetite -muscle pain -nausea -pain, redness, swelling, or irritation at site where injected -tiredness This list may not describe all possible side effects. Call your doctor for medical advice about side effects. You may report side effects to FDA at 1-800-FDA-1088. Where should I keep my medicine? This drug is given in a hospital or clinic and will not be stored at home. NOTE: This sheet is a summary. It may not cover all possible information. If you have questions about this medicine, talk to your doctor, pharmacist, or health care provider.  2018 Elsevier/Gold Standard (2013-11-04 13:26:01)  Insomnia Insomnia is a sleep disorder that makes it difficult to fall asleep or to stay  asleep. Insomnia can cause tiredness (fatigue), low energy, difficulty concentrating, mood swings, and poor performance at work or school. There are three different ways to  classify insomnia:  Difficulty falling asleep.  Difficulty staying asleep.  Waking up too early in the morning.  Any type of insomnia can be long-term (chronic) or short-term (acute). Both are common. Short-term insomnia usually lasts for three months or less. Chronic insomnia occurs at least three times a week for longer than three months. What are the causes? Insomnia may be caused by another condition, situation, or substance, such as:  Anxiety.  Certain medicines.  Gastroesophageal reflux disease (GERD) or other gastrointestinal conditions.  Asthma or other breathing conditions.  Restless legs syndrome, sleep apnea, or other sleep disorders.  Chronic pain.  Menopause. This may include hot flashes.  Stroke.  Abuse of alcohol, tobacco, or illegal drugs.  Depression.  Caffeine.  Neurological disorders, such as Alzheimer disease.  An overactive thyroid (hyperthyroidism).  The cause of insomnia may not be known. What increases the risk? Risk factors for insomnia include:  Gender. Women are more commonly affected than men.  Age. Insomnia is more common as you get older.  Stress. This may involve your professional or personal life.  Income. Insomnia is more common in people with lower income.  Lack of exercise.  Irregular work schedule or night shifts.  Traveling between different time zones.  What are the signs or symptoms? If you have insomnia, trouble falling asleep or trouble staying asleep is the main symptom. This may lead to other symptoms, such as:  Feeling fatigued.  Feeling nervous about going to sleep.  Not feeling rested in the morning.  Having trouble concentrating.  Feeling irritable, anxious, or depressed.  How is this treated? Treatment for insomnia depends on the cause. If your insomnia is caused by an underlying condition, treatment will focus on addressing the condition. Treatment may also include:  Medicines to help you  sleep.  Counseling or therapy.  Lifestyle adjustments.  Follow these instructions at home:  Take medicines only as directed by your health care provider.  Keep regular sleeping and waking hours. Avoid naps.  Keep a sleep diary to help you and your health care provider figure out what could be causing your insomnia. Include: ? When you sleep. ? When you wake up during the night. ? How well you sleep. ? How rested you feel the next day. ? Any side effects of medicines you are taking. ? What you eat and drink.  Make your bedroom a comfortable place where it is easy to fall asleep: ? Put up shades or special blackout curtains to block light from outside. ? Use a white noise machine to block noise. ? Keep the temperature cool.  Exercise regularly as directed by your health care provider. Avoid exercising right before bedtime.  Use relaxation techniques to manage stress. Ask your health care provider to suggest some techniques that may work well for you. These may include: ? Breathing exercises. ? Routines to release muscle tension. ? Visualizing peaceful scenes.  Cut back on alcohol, caffeinated beverages, and cigarettes, especially close to bedtime. These can disrupt your sleep.  Do not overeat or eat spicy foods right before bedtime. This can lead to digestive discomfort that can make it hard for you to sleep.  Limit screen use before bedtime. This includes: ? Watching TV. ? Using your smartphone, tablet, and computer.  Stick to a routine. This can help you fall asleep faster. Try to  do a quiet activity, brush your teeth, and go to bed at the same time each night.  Get out of bed if you are still awake after 15 minutes of trying to sleep. Keep the lights down, but try reading or doing a quiet activity. When you feel sleepy, go back to bed.  Make sure that you drive carefully. Avoid driving if you feel very sleepy.  Keep all follow-up appointments as directed by your health  care provider. This is important. Contact a health care provider if:  You are tired throughout the day or have trouble in your daily routine due to sleepiness.  You continue to have sleep problems or your sleep problems get worse. Get help right away if:  You have serious thoughts about hurting yourself or someone else. This information is not intended to replace advice given to you by your health care provider. Make sure you discuss any questions you have with your health care provider. Document Released: 07/01/2000 Document Revised: 12/04/2015 Document Reviewed: 04/04/2014 Elsevier Interactive Patient Education  2018 [employer].  Sinusitis, Adult Sinusitis is soreness and inflammation of your sinuses. Sinuses are hollow spaces in the bones around your face. Your sinuses are located:  Around your eyes.  In the middle of your forehead.  Behind your nose.  In your cheekbones.  Your sinuses and nasal passages are lined with a stringy fluid (mucus). Mucus normally drains out of your sinuses. When your nasal tissues become inflamed or swollen, the mucus can become trapped or blocked so air cannot flow through your sinuses. This allows bacteria, viruses, and funguses to grow, which leads to infection. Sinusitis can develop quickly and last for 7?10 days (acute) or for more than 12 weeks (chronic). Sinusitis often develops after a cold. What are the causes? This condition is caused by anything that creates swelling in the sinuses or stops mucus from draining, including:  Allergies.  Asthma.  Bacterial or viral infection.  Abnormally shaped bones between the nasal passages.  Nasal growths that contain mucus (nasal polyps).  Narrow sinus openings.  Pollutants, such as chemicals or irritants in the air.  A foreign object stuck in the nose.  A fungal infection. This is rare.  What increases the risk? The following factors may make you more likely to develop this  condition:  Having allergies or asthma.  Having had a recent cold or respiratory tract infection.  Having structural deformities or blockages in your nose or sinuses.  Having a weak immune system.  Doing a lot of swimming or diving.  Overusing nasal sprays.  Smoking.  What are the signs or symptoms? The main symptoms of this condition are pain and a feeling of pressure around the affected sinuses. Other symptoms include:  Upper toothache.  Earache.  Headache.  Bad breath.  Decreased sense of smell and taste.  A cough that may get worse at night.  Fatigue.  Fever.  Thick drainage from your nose. The drainage is often green and it may contain pus (purulent).  Stuffy nose or congestion.  Postnasal drip. This is when extra mucus collects in the throat or back of the nose.  Swelling and warmth over the affected sinuses.  Sore throat.  Sensitivity to light.  How is this diagnosed? This condition is diagnosed based on symptoms, a medical history, and a physical exam. To find out if your condition is acute or chronic, your health care provider may:  Look in your nose for signs of nasal polyps.  Tap  over the affected sinus to check for signs of infection.  View the inside of your sinuses using an imaging device that has a light attached (endoscope).  If your health care provider suspects that you have chronic sinusitis, you may also:  Be tested for allergies.  Have a sample of mucus taken from your nose (nasal culture) and checked for bacteria.  Have a mucus sample examined to see if your sinusitis is related to an allergy.  If your sinusitis does not respond to treatment and it lasts longer than 8 weeks, you may have an MRI or CT scan to check your sinuses. These scans also help to determine how severe your infection is. In rare cases, a bone biopsy may be done to rule out more serious types of fungal sinus disease. How is this treated? Treatment for  sinusitis depends on the cause and whether your condition is chronic or acute. If a virus is causing your sinusitis, your symptoms will go away on their own within 10 days. You may be given medicines to relieve your symptoms, including:  Topical nasal decongestants. They shrink swollen nasal passages and let mucus drain from your sinuses.  Antihistamines. These drugs block inflammation that is triggered by allergies. This can help to ease swelling in your nose and sinuses.  Topical nasal corticosteroids. These are nasal sprays that ease inflammation and swelling in your nose and sinuses.  Nasal saline washes. These rinses can help to get rid of thick mucus in your nose.  If your condition is caused by bacteria, you will be given an antibiotic medicine. If your condition is caused by a fungus, you will be given an antifungal medicine. Surgery may be needed to correct underlying conditions, such as narrow nasal passages. Surgery may also be needed to remove polyps. Follow these instructions at home: Medicines  Take, use, or apply over-the-counter and prescription medicines only as told by your health care provider. These may include nasal sprays.  If you were prescribed an antibiotic medicine, take it as told by your health care provider. Do not stop taking the antibiotic even if you start to feel better. Hydrate and Humidify  Drink enough water to keep your urine clear or pale yellow. Staying hydrated will help to thin your mucus.  Use a cool mist humidifier to keep the humidity level in your home above 50%.  Inhale steam for 10-15 minutes, 3-4 times a day or as told by your health care provider. You can do this in the bathroom while a hot shower is running.  Limit your exposure to cool or dry air. Rest  Rest as much as possible.  Sleep with your head raised (elevated).  Make sure to get enough sleep each night. General instructions  Apply a warm, moist washcloth to your face 3-4  times a day or as told by your health care provider. This will help with discomfort.  Wash your hands often with soap and water to reduce your exposure to viruses and other germs. If soap and water are not available, use hand sanitizer.  Do not smoke. Avoid being around people who are smoking (secondhand smoke).  Keep all follow-up visits as told by your health care provider. This is important. Contact a health care provider if:  You have a fever.  Your symptoms get worse.  Your symptoms do not improve within 10 days. Get help right away if:  You have a severe headache.  You have persistent vomiting.  You have pain or  swelling around your face or eyes.  You have vision problems.  You develop confusion.  Your neck is stiff.  You have trouble breathing. This information is not intended to replace advice given to you by your health care provider. Make sure you discuss any questions you have with your health care provider. Document Released: 07/04/2005 Document Revised: 02/28/2016 Document Reviewed: 04/29/2015 Elsevier Interactive Patient Education  [employer].

## 2017-10-20 NOTE — Progress Notes (Signed)
Pre visit review using our clinic review tool, if applicable. No additional management support is needed unless otherwise documented below in the visit note. 

## 2017-11-03 ENCOUNTER — Encounter: Payer: Self-pay | Admitting: Internal Medicine

## 2017-11-21 ENCOUNTER — Ambulatory Visit (INDEPENDENT_AMBULATORY_CARE_PROVIDER_SITE_OTHER): Payer: BLUE CROSS/BLUE SHIELD | Admitting: *Deleted

## 2017-11-21 DIAGNOSIS — Z23 Encounter for immunization: Secondary | ICD-10-CM

## 2017-12-07 ENCOUNTER — Other Ambulatory Visit: Payer: BLUE CROSS/BLUE SHIELD

## 2017-12-23 ENCOUNTER — Other Ambulatory Visit: Payer: Self-pay | Admitting: Family Medicine

## 2017-12-25 NOTE — Telephone Encounter (Signed)
This was last filled by Dr. Lacinda Axon on 12-08-16 patient has a scheduled appt with Dr. Aundra Dubin on 02-19-18. Please advise

## 2018-01-04 ENCOUNTER — Other Ambulatory Visit (INDEPENDENT_AMBULATORY_CARE_PROVIDER_SITE_OTHER): Payer: BLUE CROSS/BLUE SHIELD

## 2018-01-04 ENCOUNTER — Other Ambulatory Visit: Payer: Self-pay

## 2018-01-04 DIAGNOSIS — I10 Essential (primary) hypertension: Secondary | ICD-10-CM

## 2018-01-04 LAB — LIPID PANEL
CHOLESTEROL: 194 mg/dL (ref 0–200)
HDL: 62 mg/dL (ref >39.00)
LDL Cholesterol: 118 mg/dL — ABNORMAL HIGH (ref 0–99)
NonHDL: 131.5
TRIGLYCERIDES: 68 mg/dL (ref 0.0–149.0)
Total CHOL/HDL Ratio: 3
VLDL: 13.6 mg/dL (ref 0.0–40.0)

## 2018-01-04 MED ORDER — VALACYCLOVIR HCL 500 MG PO TABS: 500.0000 mg | ORAL_TABLET | Freq: Two times a day (BID) | ORAL | 3 refills | Status: DC | PRN

## 2018-01-29 ENCOUNTER — Ambulatory Visit
Admission: RE | Admit: 2018-01-29 | Discharge: 2018-01-29 | Disposition: A | Discharge status: Home / Self Care | Payer: BLUE CROSS/BLUE SHIELD | Source: Ambulatory Visit | Attending: Internal Medicine | Admitting: Internal Medicine

## 2018-01-29 DIAGNOSIS — Z1231 Encounter for screening mammogram for malignant neoplasm of breast: Secondary | ICD-10-CM

## 2018-02-19 ENCOUNTER — Ambulatory Visit: Payer: BLUE CROSS/BLUE SHIELD | Admitting: Internal Medicine

## 2018-02-19 DIAGNOSIS — Z0289 Encounter for other administrative examinations: Secondary | ICD-10-CM

## 2018-02-21 ENCOUNTER — Encounter: Payer: BLUE CROSS/BLUE SHIELD | Admitting: Obstetrics and Gynecology

## 2018-04-24 ENCOUNTER — Ambulatory Visit (INDEPENDENT_AMBULATORY_CARE_PROVIDER_SITE_OTHER): Payer: BLUE CROSS/BLUE SHIELD | Admitting: *Deleted

## 2018-04-24 DIAGNOSIS — Z23 Encounter for immunization: Secondary | ICD-10-CM | POA: Diagnosis not present

## 2018-05-30 ENCOUNTER — Ambulatory Visit: Payer: BLUE CROSS/BLUE SHIELD | Admitting: Internal Medicine

## 2018-06-28 ENCOUNTER — Ambulatory Visit: Payer: BLUE CROSS/BLUE SHIELD | Admitting: Internal Medicine

## 2018-06-28 ENCOUNTER — Encounter: Payer: Self-pay | Admitting: Internal Medicine

## 2018-06-28 VITALS — BP 112/68 | HR 60 | Temp 98.7°F | Ht 68.0 in | Wt 210.4 lb

## 2018-06-28 DIAGNOSIS — E559 Vitamin D deficiency, unspecified: Secondary | ICD-10-CM

## 2018-06-28 DIAGNOSIS — G4700 Insomnia, unspecified: Secondary | ICD-10-CM | POA: Diagnosis not present

## 2018-06-28 DIAGNOSIS — R102 Pelvic and perineal pain: Secondary | ICD-10-CM

## 2018-06-28 DIAGNOSIS — Z91018 Allergy to other foods: Secondary | ICD-10-CM

## 2018-06-28 DIAGNOSIS — Z1159 Encounter for screening for other viral diseases: Secondary | ICD-10-CM

## 2018-06-28 DIAGNOSIS — J309 Allergic rhinitis, unspecified: Secondary | ICD-10-CM

## 2018-06-28 DIAGNOSIS — L659 Nonscarring hair loss, unspecified: Secondary | ICD-10-CM

## 2018-06-28 DIAGNOSIS — Z1231 Encounter for screening mammogram for malignant neoplasm of breast: Secondary | ICD-10-CM

## 2018-06-28 DIAGNOSIS — R600 Localized edema: Secondary | ICD-10-CM

## 2018-06-28 DIAGNOSIS — E669 Obesity, unspecified: Secondary | ICD-10-CM

## 2018-06-28 DIAGNOSIS — Z1211 Encounter for screening for malignant neoplasm of colon: Secondary | ICD-10-CM

## 2018-06-28 DIAGNOSIS — Z1389 Encounter for screening for other disorder: Secondary | ICD-10-CM

## 2018-06-28 DIAGNOSIS — Z1329 Encounter for screening for other suspected endocrine disorder: Secondary | ICD-10-CM

## 2018-06-28 DIAGNOSIS — J329 Chronic sinusitis, unspecified: Secondary | ICD-10-CM | POA: Diagnosis not present

## 2018-06-28 DIAGNOSIS — I10 Essential (primary) hypertension: Secondary | ICD-10-CM

## 2018-06-28 DIAGNOSIS — D219 Benign neoplasm of connective and other soft tissue, unspecified: Secondary | ICD-10-CM

## 2018-06-28 DIAGNOSIS — Z0184 Encounter for antibody response examination: Secondary | ICD-10-CM

## 2018-06-28 DIAGNOSIS — R7303 Prediabetes: Secondary | ICD-10-CM

## 2018-06-28 HISTORY — DX: Allergy to other foods: Z91.018

## 2018-06-28 HISTORY — DX: Nonscarring hair loss, unspecified: L65.9

## 2018-06-28 MED ORDER — FLUTICASONE PROPIONATE 50 MCG/ACT NA SUSP: 1.0000 | Freq: Every day | NASAL | 12 refills | Status: DC

## 2018-06-28 MED ORDER — AMITRIPTYLINE HCL 10 MG PO TABS: 10.0000 mg | ORAL_TABLET | Freq: Every day | ORAL | 3 refills | Status: DC

## 2018-06-28 MED ORDER — LORATADINE 10 MG PO TABS: 10.0000 mg | ORAL_TABLET | Freq: Every day | ORAL | 3 refills | Status: DC

## 2018-06-28 MED ORDER — SALINE SPRAY 0.65 % NA SOLN: 2.0000 | NASAL | 12 refills | Status: DC | PRN

## 2018-06-28 NOTE — Patient Instructions (Addendum)
Dr. Blima Rich Ward [facility]   GI colonoscopy   Let me about dermatology, Biotin, shampoos with tea tree  Schedule fasting labs 09/11/2018  Consider food allergy testing with allergist in future     Pelvic Pain, Female Pelvic pain is pain in your lower abdomen, below your belly button and between your hips. The pain may start suddenly (acute), keep coming back (recurring), or last a long time (chronic). Pelvic pain that lasts longer than six months is considered chronic. Pelvic pain may affect your:  Reproductive organs.  Urinary system.  Digestive tract.  Musculoskeletal system.  There are many potential causes of pelvic pain. Sometimes, the pain can be a result of digestive or urinary conditions, strained muscles or ligaments, or even reproductive conditions. Sometimes the cause of pelvic pain is not known. Follow these instructions at home:  Take over-the-counter and prescription medicines only as told by your health care provider.  Rest as told by your health care provider.  Do not have sex it if hurts.  Keep a journal of your pelvic pain. Write down: ? When the pain started. ? Where the pain is located. ? What seems to make the pain better or worse, such as food or your menstrual cycle. ? Any symptoms you have along with the pain.  Keep all follow-up visits as told by your health care provider. This is important. Contact a health care provider if:  Medicine does not help your pain.  Your pain comes back.  You have new symptoms.  You have abnormal vaginal discharge or bleeding, including bleeding after menopause.  You have a fever or chills.  You are constipated.  You have blood in your urine or stool.  You have foul-smelling urine.  You feel weak or lightheaded. Get help right away if:  You have sudden severe pain.  Your pain gets steadily worse.  You have severe pain along with fever, nausea, vomiting, or excessive sweating.  You  lose consciousness. This information is not intended to replace advice given to you by your health care provider. Make sure you discuss any questions you have with your health care provider. Document Released: 05/31/2004 Document Revised: 07/29/2015 Document Reviewed: 04/24/2015 Elsevier Interactive Patient Education  2018 [employer].    Colonoscopy, Adult A colonoscopy is an exam to look at the entire large intestine. During the exam, a lubricated, bendable tube is inserted into the anus and then passed into the rectum, colon, and other parts of the large intestine. A colonoscopy is often done as a part of normal colorectal screening or in response to certain symptoms, such as anemia, persistent diarrhea, abdominal pain, and blood in the stool. The exam can help screen for and diagnose medical problems, including:  Tumors.  Polyps.  Inflammation.  Areas of bleeding.  Tell a health care provider about:  Any allergies you have.  All medicines you are taking, including vitamins, herbs, eye drops, creams, and over-the-counter medicines.  Any problems you or family members have had with anesthetic medicines.  Any blood disorders you have.  Any surgeries you have had.  Any medical conditions you have.  Any problems you have had passing stool. What are the risks? Generally, this is a safe procedure. However, problems may occur, including:  Bleeding.  A tear in the intestine.  A reaction to medicines given during the exam.  Infection (rare).  What happens before the procedure? Eating and drinking restrictions Follow instructions from your health care provider about eating and drinking,  which may include:  A few days before the procedure - follow a low-fiber diet. Avoid nuts, seeds, dried fruit, raw fruits, and vegetables.  1-3 days before the procedure - follow a clear liquid diet. Drink only clear liquids, such as clear broth or bouillon, black coffee or tea, clear  juice, clear soft drinks or sports drinks, gelatin dessert, and popsicles. Avoid any liquids that contain red or purple dye.  On the day of the procedure - do not eat or drink anything during the 2 hours before the procedure, or within the time period that your health care provider recommends.  Bowel prep If you were prescribed an oral bowel prep to clean out your colon:  Take it as told by your health care provider. Starting the day before your procedure, you will need to drink a large amount of medicated liquid. The liquid will cause you to have multiple loose stools until your stool is almost clear or light green.  If your skin or anus gets irritated from diarrhea, you may use these to relieve the irritation: ? Medicated wipes, such as adult wet wipes with aloe and vitamin E. ? A skin soothing-product like petroleum jelly.  If you vomit while drinking the bowel prep, take a break for up to 60 minutes and then begin the bowel prep again. If vomiting continues and you cannot take the bowel prep without vomiting, call your health care provider.  General instructions  Ask your health care provider about changing or stopping your regular medicines. This is especially important if you are taking diabetes medicines or blood thinners.  Plan to have someone take you home from the hospital or clinic. What happens during the procedure?  An IV tube may be inserted into one of your veins.  You will be given medicine to help you relax (sedative).  To reduce your risk of infection: ? Your health care team will wash or sanitize their hands. ? Your anal area will be washed with soap.  You will be asked to lie on your side with your knees bent.  Your health care provider will lubricate a long, thin, flexible tube. The tube will have a camera and a light on the end.  The tube will be inserted into your anus.  The tube will be gently eased through your rectum and colon.  Air will be delivered  into your colon to keep it open. You may feel some pressure or cramping.  The camera will be used to take images during the procedure.  A small tissue sample may be removed from your body to be examined under a microscope (biopsy). If any potential problems are found, the tissue will be sent to a lab for testing.  If small polyps are found, your health care provider may remove them and have them checked for cancer cells.  The tube that was inserted into your anus will be slowly removed. The procedure may vary among health care providers and hospitals. What happens after the procedure?  Your blood pressure, heart rate, breathing rate, and blood oxygen level will be monitored until the medicines you were given have worn off.  Do not drive for 24 hours after the exam.  You may have a small amount of blood in your stool.  You may pass gas and have mild abdominal cramping or bloating due to the air that was used to inflate your colon during the exam.  It is up to you to get the results of your procedure.  Ask your health care provider, or the department performing the procedure, when your results will be ready. This information is not intended to replace advice given to you by your health care provider. Make sure you discuss any questions you have with your health care provider. Document Released: 07/01/2000 Document Revised: 05/04/2016 Document Reviewed: 09/15/2015 Elsevier Interactive Patient Education  2018 [employer].  Uterine Fibroids Uterine fibroids are tissue masses (tumors) that can develop in the womb (uterus). They are also called leiomyomas. This type of tumor is not cancerous (benign) and does not spread to other parts of the body outside of the pelvic area, which is between the hip bones. Occasionally, fibroids may develop in the fallopian tubes, in the cervix, or on the support structures (ligaments) that surround the uterus. You can have one or many fibroids. Fibroids can vary  in size, weight, and where they grow in the uterus. Some can become quite large. Most fibroids do not require medical treatment. What are the causes? A fibroid can develop when a single uterine cell keeps growing (replicating). Most cells in the human body have a control mechanism that keeps them from replicating without control. What are the signs or symptoms? Symptoms may include:  Heavy bleeding during your period.  Bleeding or spotting between periods.  Pelvic pain and pressure.  Bladder problems, such as needing to urinate more often (urinary frequency) or urgently.  Inability to reproduce offspring (infertility).  Miscarriages.  How is this diagnosed? Uterine fibroids are diagnosed through a physical exam. Your health care provider may feel the lumpy tumors during a pelvic exam. Ultrasonography and an MRI may be done to determine the size, location, and number of fibroids. How is this treated? Treatment may include:  Watchful waiting. This involves getting the fibroid checked by your health care provider to see if it grows or shrinks. Follow your health care provider's recommendations for how often to have this checked.  Hormone medicines. These can be taken by mouth or given through an intrauterine device (IUD).  Surgery. ? Removing the fibroids (myomectomy) or the uterus (hysterectomy). ? Removing blood supply to the fibroids (uterine artery embolization).  If fibroids interfere with your fertility and you want to become pregnant, your health care provider may recommend having the fibroids removed. Follow these instructions at home:  Keep all follow-up visits as directed by your health care provider. This is important.  Take over-the-counter and prescription medicines only as told by your health care provider. ? If you were prescribed a hormone treatment, take the hormone medicines exactly as directed.  Ask your health care provider about taking iron pills and  increasing the amount of dark green, leafy vegetables in your diet. These actions can help to boost your blood iron levels, which may be affected by heavy menstrual bleeding.  Pay close attention to your period and tell your health care provider about any changes, such as: ? Increased blood flow that requires you to use more pads or tampons than usual per month. ? A change in the number of days that your period lasts per month. ? A change in symptoms that are associated with your period, such as abdominal cramping or back pain. Contact a health care provider if:  You have pelvic pain, back pain, or abdominal cramps that cannot be controlled with medicines.  You have an increase in bleeding between and during periods.  You soak tampons or pads in a half hour or less.  You feel lightheaded, extra tired, or  weak. Get help right away if:  You faint.  You have a sudden increase in pelvic pain. This information is not intended to replace advice given to you by your health care provider. Make sure you discuss any questions you have with your health care provider. Document Released: 07/01/2000 Document Revised: 03/03/2016 Document Reviewed: 12/31/2013 Elsevier Interactive Patient Education  [employer].

## 2018-06-28 NOTE — Progress Notes (Signed)
Chief Complaint  Patient presents with  . Follow-up   F/u  1. HTN controlled on prinzide 10-12.5 mg qd  2. Insomnia needs refill of amitriptyline  3. C/o lower pelvic pain reviewed US pelvic 07/2017 +fibroids. Denies constipation. She also notices abdominal pain when stressed at work  4. Obesity goal wt loss is down to 180 lsb  5. Allergies to food and environmental wants refill of NS and flonase and used to take Claritin in [place]. She is also developing a lot of food allergies since her 13s  6. C/o hair loss   Review of Systems  Constitutional: Negative for weight loss.  HENT: Negative for hearing loss.   Eyes: Negative for blurred vision.  Respiratory: Negative for shortness of breath.   Gastrointestinal: Positive for abdominal pain. Negative for constipation.  Skin: Negative for rash.       +hair loss    Neurological: Negative for headaches.  Psychiatric/Behavioral: Negative for depression.   Past Medical History:  Diagnosis Date  . Genital herpes   . Genital warts   . Hypertension   . Migraine    Past Surgical History:  Procedure Laterality Date  . CHOLECYSTECTOMY    . ECTOPIC PREGNANCY SURGERY Left   . EMBOLIZATION     For fibroid   Family History  Problem Relation Age of Onset  . Arthritis Mother   . Diabetes Mother   . Diabetes Father   . Cancer Brother   . Cancer Maternal Grandfather   . Breast cancer Maternal Aunt   . Ovarian cancer Maternal Aunt   . Colon cancer Neg Hx    Social History   Socioeconomic History  . Marital status: Single    Spouse name: Not on file  . Number of children: Not on file  . Years of education: Not on file  . Highest education level: Not on file  Occupational History  . Not on file  Social Needs  . Financial resource strain: Not on file  . Food insecurity:    Worry: Not on file    Inability: Not on file  . Transportation needs:    Medical: Not on file    Non-medical: Not on file  Tobacco Use  . Smoking status: Former  Smoker    Last attempt to quit: 2007    Years since quitting: 12.9  . Smokeless tobacco: Never Used  Substance and Sexual Activity  . Alcohol use: Yes    Alcohol/week: 2.0 standard drinks    Types: 2 Glasses of wine per week    Comment: per 2 weeks   . Drug use: No  . Sexual activity: Yes    Birth control/protection: Condom  Lifestyle  . Physical activity:    Days per week: Not on file    Minutes per session: Not on file  . Stress: Not on file  Relationships  . Social connections:    Talks on phone: Not on file    Gets together: Not on file    Attends religious service: Not on file    Active member of club or organization: Not on file    Attends meetings of clubs or organizations: Not on file    Relationship status: Not on file  . Intimate partner violence:    Fear of current or ex partner: Not on file    Emotionally abused: Not on file    Physically abused: Not on file    Forced sexual activity: Not on file  Other Topics Concern  .  Not on file  Social History Narrative  . Not on file   Current Meds  Medication Sig  . amitriptyline (ELAVIL) 10 MG tablet Take 1 tablet (10 mg total) by mouth at bedtime.  Marland Kitchen lisinopril-hydrochlorothiazide (PRINZIDE,ZESTORETIC) 10-12.5 MG tablet Take 1 tablet by mouth daily.  . sodium chloride (OCEAN) 0.65 % SOLN nasal spray Place 2 sprays into both nostrils as needed for congestion.  . valACYclovir (VALTREX) 500 MG tablet Take 1 tablet (500 mg total) by mouth 2 (two) times daily as needed.  . [DISCONTINUED] amitriptyline (ELAVIL) 10 MG tablet Take 10 mg by mouth at bedtime.   . [DISCONTINUED] sodium chloride (OCEAN) 0.65 % SOLN nasal spray Place 2 sprays into both nostrils as needed for congestion.   No Known Allergies No results found for this or any previous visit (from the past 2160 hour(s)). Objective  Body mass index is 31.99 kg/m. Wt Readings from Last 3 Encounters:  06/28/18 210 lb 6.4 oz (95.4 kg)  10/20/17 214 lb 9.6 oz (97.3  kg)  09/11/17 211 lb 3.2 oz (95.8 kg)   Temp Readings from Last 3 Encounters:  06/28/18 98.7 F (37.1 C) (Oral)  10/20/17 98.2 F (36.8 C) (Oral)  09/11/17 98.7 F (37.1 C) (Oral)   BP Readings from Last 3 Encounters:  06/28/18 112/68  10/20/17 110/64  09/11/17 98/70   Pulse Readings from Last 3 Encounters:  06/28/18 60  10/20/17 76  09/11/17 (!) 58    Physical Exam Vitals signs and nursing note reviewed.  Constitutional:      Appearance: Normal appearance. She is well-developed and normal weight.  HENT:     Head: Normocephalic and atraumatic.  Cardiovascular:     Rate and Rhythm: Normal rate and regular rhythm.     Heart sounds: Normal heart sounds.     Comments: Mild leg edema b/l  Pulmonary:     Effort: Pulmonary effort is normal.     Breath sounds: Normal breath sounds.  Skin:    General: Skin is warm and dry.  Neurological:     General: No focal deficit present.     Mental Status: She is alert and oriented to person, place, and time.     Gait: Gait normal.  Psychiatric:        Attention and Perception: Attention and perception normal.        Mood and Affect: Mood and affect normal.        Speech: Speech normal.        Behavior: Behavior normal. Behavior is cooperative.        Thought Content: Thought content normal.        Cognition and Memory: Cognition and memory normal.        Judgment: Judgment normal.     Assessment   1. HTN  2. Insomnia  3. Pelvic pain with h/o fibroids  4. Obesity  5. Allergies food and environmental  6. Hair loss  7. HM Plan   1. Cont meds  Check fasting labs 09/11/18 2. Prn Amitriptyline refilled  3. Referred to GI and OB/GYN likely needs repeat TVUS and pelvic US  4. rec exercise to lose  5. Consider allergy in future  6. Disc tea tree topically and otc biotin pills  7.  Had flu shot 10//8/19  Tdap had  Hep B 3/3  Pap neg 07/05/17 neg hpv Dr. Enzo Bi mammo neg 01/29/18 referred  Colonoscopy due age 27 y.o  referred today   sch fasting labs  Former smoker quit Nov 04, 2005 smoked from age 52 to 11-04-05 1 ppd FH brother died lung cancer Provider: Dr. Olivia Mackie McLean-Scocuzza-Internal Medicine

## 2018-08-06 ENCOUNTER — Encounter: Payer: Self-pay | Admitting: Internal Medicine

## 2018-08-16 ENCOUNTER — Ambulatory Visit: Payer: BLUE CROSS/BLUE SHIELD | Admitting: Gastroenterology

## 2018-08-16 ENCOUNTER — Other Ambulatory Visit: Payer: Self-pay

## 2018-08-16 ENCOUNTER — Encounter: Payer: Self-pay | Admitting: Gastroenterology

## 2018-08-16 VITALS — BP 104/70 | HR 80 | Resp 17 | Ht 68.0 in | Wt 212.6 lb

## 2018-08-16 DIAGNOSIS — Z1211 Encounter for screening for malignant neoplasm of colon: Secondary | ICD-10-CM | POA: Diagnosis not present

## 2018-08-16 DIAGNOSIS — K219 Gastro-esophageal reflux disease without esophagitis: Secondary | ICD-10-CM | POA: Diagnosis not present

## 2018-08-16 NOTE — Progress Notes (Signed)
Nichole Darby, MD [address]  [place]  [place], [place] [address]  Main: [phone]  Fax: [phone]    Gastroenterology Consultation  Referring Provider:     McLean-Scocuzza, Nichole Mackie * Primary Care Physician:  Rodriguez, Nichole Glow, MD Primary Gastroenterologist:  Dr. Cephas Rodriguez Reason for Consultation:     GERD and colon cancer screening        HPI:   Nichole Rodriguez is a 53 y.o. female referred by Dr. Terese Door, Nichole Glow, MD  for consultation & management of  GERD and colon cancer screening  GERD: 10 years of intermittent heart burn, regurgitation of gastric contents and epigastric pain, usually last for 2 days. Sometimes, nocturnal heart burn,Tried prevacid in the past which helped. Currently, on OTC antacids. Denies dysphagia. Modified her diet Denies smoking or ETOH use  No weight loss, LOA, n/v  NSAIDs: None  Antiplts/Anticoagulants/Anti thrombotics: None  GI Procedures:  None Denies any GI surgeries  No GI malignancy early onset in her family  Past Medical History:  Diagnosis Date  . Fibroids   . Genital herpes   . Genital warts   . Hyperlipidemia   . Hypertension   . Migraine     Past Surgical History:  Procedure Laterality Date  . CHOLECYSTECTOMY    . ECTOPIC PREGNANCY SURGERY Left   . EMBOLIZATION     For fibroid    Current Outpatient Medications:  .  amitriptyline (ELAVIL) 10 MG tablet, Take 1 tablet (10 mg total) by mouth at bedtime., Disp: 90 tablet, Rfl: 3 .  fluticasone (FLONASE) 50 MCG/ACT nasal spray, Place 1-2 sprays into both nostrils daily., Disp: 16 g, Rfl: 12 .  lisinopril-hydrochlorothiazide (PRINZIDE,ZESTORETIC) 10-12.5 MG tablet, Take 1 tablet by mouth daily., Disp: 90 tablet, Rfl: 3 .  sodium chloride (OCEAN) 0.65 % SOLN nasal spray, Place 2 sprays into both nostrils as needed for congestion., Disp: 1 Bottle, Rfl: 12 .  valACYclovir (VALTREX) 500 MG tablet, Take 1 tablet (500 mg total) by mouth 2 (two)  times daily as needed., Disp: 90 tablet, Rfl: 3 .  loratadine (CLARITIN) 10 MG tablet, Take 1 tablet (10 mg total) by mouth daily. (Patient not taking: Reported on 08/16/2018), Disp: 90 tablet, Rfl: 3   Family History  Problem Relation Age of Onset  . Arthritis Mother   . Diabetes Mother   . Diabetes Father   . Cancer Brother   . Cancer Maternal Grandfather   . Breast cancer Maternal Aunt   . Ovarian cancer Maternal Aunt   . Colon cancer Neg Hx      Social History   Tobacco Use  . Smoking status: Former Smoker    Last attempt to quit: 2007    Years since quitting: 13.0  . Smokeless tobacco: Never Used  Substance Use Topics  . Alcohol use: Yes    Alcohol/week: 2.0 standard drinks    Types: 2 Glasses of wine per week    Comment: per 2 weeks   . Drug use: No    Allergies as of 08/16/2018  . (No Known Allergies)    Review of Systems:    All systems reviewed and negative except where noted in HPI.   Physical Exam:  BP 104/70 (BP Location: Left Arm, Patient Position: Sitting, Cuff Size: Large)   Pulse 80   Resp 17   Ht 5\' 8"  (1.727 m)   Wt 212 lb 9.6 oz (96.4 kg)   BMI 32.33 kg/m  No LMP recorded.  General:   Alert,  Well-developed, well-nourished, pleasant and cooperative in NAD Head:  Normocephalic and atraumatic. Eyes:  Sclera clear, no icterus.   Conjunctiva pink. Ears:  Normal auditory acuity. Nose:  No deformity, discharge, or lesions. Mouth:  No deformity or lesions,oropharynx pink & moist. Neck:  Supple; no masses or thyromegaly. Lungs:  Respirations even and unlabored.  Clear throughout to auscultation.   No wheezes, crackles, or rhonchi. No acute distress. Heart:  Regular rate and rhythm; no murmurs, clicks, rubs, or gallops. Abdomen:  Normal bowel sounds. Soft, non-tender and non-distended without masses, hepatosplenomegaly or hernias noted.  No guarding or rebound tenderness.   Rectal: Not performed Msk:  Symmetrical without gross deformities. Good,  equal movement & strength bilaterally. Pulses:  Normal pulses noted. Extremities:  No clubbing or edema.  No cyanosis. Neurologic:  Alert and oriented x3;  grossly normal neurologically. Skin:  Intact without significant lesions or rashes. No jaundice. Psych:  Alert and cooperative. Normal mood and affect.  Imaging Studies: Reviewed  Assessment and Plan:   Nichole Rodriguez is a 53 y.o. female with no significant PMH with chronic intermittent reflux symptoms and colon cancer screening  GERD Prilosec OTC daily Anti reflux lifestyle EGD to screen for barrett's  Colonoscopy for colon cancer screening   Follow up based on the above tests   Nichole Darby, MD

## 2018-08-16 NOTE — Patient Instructions (Signed)

## 2018-08-27 ENCOUNTER — Encounter: Payer: Self-pay | Admitting: *Deleted

## 2018-08-28 ENCOUNTER — Ambulatory Visit: Payer: BLUE CROSS/BLUE SHIELD | Admitting: Anesthesiology

## 2018-08-28 ENCOUNTER — Ambulatory Visit
Admission: RE | Admit: 2018-08-28 | Discharge: 2018-08-28 | Disposition: A | Discharge status: Home / Self Care | Payer: BLUE CROSS/BLUE SHIELD | Attending: Gastroenterology | Admitting: Gastroenterology

## 2018-08-28 ENCOUNTER — Other Ambulatory Visit: Payer: Self-pay

## 2018-08-28 ENCOUNTER — Encounter
Admission: RE | Disposition: A | Discharge status: Home / Self Care | Payer: Self-pay | Source: Home / Self Care | Attending: Gastroenterology

## 2018-08-28 ENCOUNTER — Encounter: Payer: Self-pay | Admitting: Student

## 2018-08-28 DIAGNOSIS — K449 Diaphragmatic hernia without obstruction or gangrene: Secondary | ICD-10-CM | POA: Insufficient documentation

## 2018-08-28 DIAGNOSIS — K3189 Other diseases of stomach and duodenum: Secondary | ICD-10-CM

## 2018-08-28 DIAGNOSIS — K219 Gastro-esophageal reflux disease without esophagitis: Secondary | ICD-10-CM | POA: Diagnosis not present

## 2018-08-28 DIAGNOSIS — G43909 Migraine, unspecified, not intractable, without status migrainosus: Secondary | ICD-10-CM | POA: Diagnosis not present

## 2018-08-28 DIAGNOSIS — Z8041 Family history of malignant neoplasm of ovary: Secondary | ICD-10-CM | POA: Diagnosis not present

## 2018-08-28 DIAGNOSIS — Z833 Family history of diabetes mellitus: Secondary | ICD-10-CM | POA: Diagnosis not present

## 2018-08-28 DIAGNOSIS — Z809 Family history of malignant neoplasm, unspecified: Secondary | ICD-10-CM | POA: Insufficient documentation

## 2018-08-28 DIAGNOSIS — I10 Essential (primary) hypertension: Secondary | ICD-10-CM | POA: Insufficient documentation

## 2018-08-28 DIAGNOSIS — R1013 Epigastric pain: Secondary | ICD-10-CM

## 2018-08-28 DIAGNOSIS — Z1211 Encounter for screening for malignant neoplasm of colon: Secondary | ICD-10-CM | POA: Insufficient documentation

## 2018-08-28 DIAGNOSIS — B9681 Helicobacter pylori [H. pylori] as the cause of diseases classified elsewhere: Secondary | ICD-10-CM | POA: Insufficient documentation

## 2018-08-28 DIAGNOSIS — K2930 Chronic superficial gastritis without bleeding: Secondary | ICD-10-CM | POA: Diagnosis not present

## 2018-08-28 DIAGNOSIS — E669 Obesity, unspecified: Secondary | ICD-10-CM | POA: Insufficient documentation

## 2018-08-28 DIAGNOSIS — K269 Duodenal ulcer, unspecified as acute or chronic, without hemorrhage or perforation: Secondary | ICD-10-CM | POA: Insufficient documentation

## 2018-08-28 DIAGNOSIS — Z6831 Body mass index (BMI) 31.0-31.9, adult: Secondary | ICD-10-CM | POA: Diagnosis not present

## 2018-08-28 DIAGNOSIS — E785 Hyperlipidemia, unspecified: Secondary | ICD-10-CM | POA: Diagnosis not present

## 2018-08-28 DIAGNOSIS — Z79899 Other long term (current) drug therapy: Secondary | ICD-10-CM | POA: Insufficient documentation

## 2018-08-28 DIAGNOSIS — Z803 Family history of malignant neoplasm of breast: Secondary | ICD-10-CM | POA: Diagnosis not present

## 2018-08-28 DIAGNOSIS — K2950 Unspecified chronic gastritis without bleeding: Secondary | ICD-10-CM | POA: Diagnosis not present

## 2018-08-28 DIAGNOSIS — Z87891 Personal history of nicotine dependence: Secondary | ICD-10-CM | POA: Insufficient documentation

## 2018-08-28 DIAGNOSIS — Z8261 Family history of arthritis: Secondary | ICD-10-CM | POA: Diagnosis not present

## 2018-08-28 HISTORY — PX: ESOPHAGOGASTRODUODENOSCOPY (EGD) WITH PROPOFOL: SHX5813

## 2018-08-28 HISTORY — PX: COLONOSCOPY WITH PROPOFOL: SHX5780

## 2018-08-28 LAB — POCT PREGNANCY, URINE: Preg Test, Ur: NEGATIVE

## 2018-08-28 SURGERY — ESOPHAGOGASTRODUODENOSCOPY (EGD) WITH PROPOFOL
Anesthesia: General

## 2018-08-28 MED ORDER — SODIUM CHLORIDE 0.9 % IV SOLN
INTRAVENOUS | Status: DC
  Administered 2018-08-28: 14:00:00 via INTRAVENOUS

## 2018-08-28 MED ORDER — PROPOFOL 500 MG/50ML IV EMUL
INTRAVENOUS | Status: DC | PRN
  Administered 2018-08-28: 175 ug/kg/min via INTRAVENOUS

## 2018-08-28 MED ORDER — PROPOFOL 10 MG/ML IV BOLUS
INTRAVENOUS | Status: DC | PRN
  Administered 2018-08-28: 70 mg via INTRAVENOUS
  Administered 2018-08-28: 50 mg via INTRAVENOUS
  Administered 2018-08-28: 30 mg via INTRAVENOUS

## 2018-08-28 MED ORDER — LIDOCAINE HCL (PF) 2 % IJ SOLN
INTRAMUSCULAR | Status: DC | PRN
  Administered 2018-08-28: 100 mg via INTRADERMAL

## 2018-08-28 MED ORDER — OMEPRAZOLE 40 MG PO CPDR: 40.0000 mg | DELAYED_RELEASE_CAPSULE | Freq: Two times a day (BID) | ORAL | 1 refills | Status: DC

## 2018-08-28 NOTE — Op Note (Signed)
[facility] Gastroenterology Patient Name: Nichole Rodriguez Procedure Date: 08/28/2018 2:07 PM MRN: [id number] Account #: [id number] Date of Birth: [birth year] Admit Type: Outpatient Age: 53 Room: [facility] ENDO ROOM 4 Gender: Female Note Status: Finalized Procedure:            Colonoscopy Indications:          Screening for colorectal malignant neoplasm, This is                        the patient's first colonoscopy Providers:            Lin Landsman MD, MD Medicines:            Monitored Anesthesia Care Complications:        No immediate complications. Estimated blood loss: None. Procedure:            Pre-Anesthesia Assessment:                       - Prior to the procedure, a History and Physical was                        performed, and patient medications and allergies were                        reviewed. The patient is competent. The risks and                        benefits of the procedure and the sedation options and                        risks were discussed with the patient. All questions                        were answered and informed consent was obtained.                        Patient identification and proposed procedure were                        verified by the physician, the nurse, the                        anesthesiologist, the anesthetist and the technician in                        the pre-procedure area in the procedure room in the                        endoscopy suite. Mental Status Examination: alert and                        oriented. Airway Examination: normal oropharyngeal                        airway and neck mobility. Respiratory Examination:                        clear to auscultation. CV Examination: normal.  Prophylactic Antibiotics: The patient does not require                        prophylactic antibiotics. Prior Anticoagulants: The                        patient has taken no previous anticoagulant  or                        antiplatelet agents. ASA Grade Assessment: II - A                        patient with mild systemic disease. After reviewing the                        risks and benefits, the patient was deemed in                        satisfactory condition to undergo the procedure. The                        anesthesia plan was to use monitored anesthesia care                        (MAC). Immediately prior to administration of                        medications, the patient was re-assessed for adequacy                        to receive sedatives. The heart rate, respiratory rate,                        oxygen saturations, blood pressure, adequacy of                        pulmonary ventilation, and response to care were                        monitored throughout the procedure. The physical status                        of the patient was re-assessed after the procedure.                       After obtaining informed consent, the colonoscope was                        passed under direct vision. Throughout the procedure,                        the patient's blood pressure, pulse, and oxygen                        saturations were monitored continuously. The                        Colonoscope was introduced through the anus and  advanced to the the terminal ileum, with identification                        of the appendiceal orifice and IC valve. The                        colonoscopy was performed without difficulty. The                        patient tolerated the procedure well. The quality of                        the bowel preparation was evaluated using the BBPS                        [facility] Bowel Preparation Scale) with scores of: Right                        Colon = 3, Transverse Colon = 3 and Left Colon = 3                        (entire mucosa seen well with no residual staining,                        small fragments of stool or opaque  liquid). The total                        BBPS score equals 9. Findings:      The perianal and digital rectal examinations were normal. Pertinent       negatives include normal sphincter tone and no palpable rectal lesions.      The terminal ileum appeared normal.      The entire examined colon appeared normal.      The retroflexed view of the distal rectum and anal verge was normal and       showed no anal or rectal abnormalities. Estimated blood loss: none. Impression:           - The examined portion of the ileum was normal.                       - The entire examined colon is normal.                       - The distal rectum and anal verge are normal on                        retroflexion view.                       - No specimens collected. Recommendation:       - Discharge patient to home (with escort).                       - Resume previous diet today.                       - Continue present medications.                       - Repeat colonoscopy in 10 years  for surveillance.                       - Return to my office as previously scheduled. Procedure Code(s):    --- Professional ---                       P8099, Colorectal cancer screening; colonoscopy on                        individual not meeting criteria for high risk Diagnosis Code(s):    --- Professional ---                       Z12.11, Encounter for screening for malignant neoplasm                        of colon CPT copyright 2018 American Medical Association. All rights reserved. The codes documented in this report are preliminary and upon coder review may  be revised to meet current compliance requirements. Dr. Ulyess Mort Lin Landsman MD, MD 08/28/2018 2:47:47 PM This report has been signed electronically. Number of Addenda: 0 Note Initiated On: 08/28/2018 2:07 PM Scope Withdrawal Time: 0 hours 6 minutes 23 seconds  Total Procedure Duration: 0 hours 11 minutes 4 seconds       [facility]

## 2018-08-28 NOTE — H&P (Signed)
Cephas Darby, MD [address]  [place]  [place], [place] [address]  Main: [phone]  Fax: [phone] Pager: [phone]  Primary Care Physician:  McLean-Scocuzza, Nino Glow, MD Primary Gastroenterologist:  Dr. Cephas Darby  Pre-Procedure History & Physical: HPI:  Nichole Rodriguez is a 53 y.o. female is here for an endoscopy and colonoscopy.   Past Medical History:  Diagnosis Date  . Fibroids   . Genital herpes   . Genital warts   . Hyperlipidemia   . Hypertension   . Migraine     Past Surgical History:  Procedure Laterality Date  . ECTOPIC PREGNANCY SURGERY Left   . EMBOLIZATION     For fibroid    Prior to Admission medications   Medication Sig Start Date End Date Taking? Authorizing Provider  amitriptyline (ELAVIL) 10 MG tablet Take 1 tablet (10 mg total) by mouth at bedtime. 06/28/18   McLean-Scocuzza, Nino Glow, MD  fluticasone (FLONASE) 50 MCG/ACT nasal spray Place 1-2 sprays into both nostrils daily. 06/28/18   McLean-Scocuzza, Nino Glow, MD  lisinopril-hydrochlorothiazide (PRINZIDE,ZESTORETIC) 10-12.5 MG tablet Take 1 tablet by mouth daily. 09/11/17   McLean-Scocuzza, Nino Glow, MD  loratadine (CLARITIN) 10 MG tablet Take 1 tablet (10 mg total) by mouth daily. Patient not taking: Reported on 08/16/2018 06/28/18   McLean-Scocuzza, Nino Glow, MD  sodium chloride (OCEAN) 0.65 % SOLN nasal spray Place 2 sprays into both nostrils as needed for congestion. 06/28/18   McLean-Scocuzza, Nino Glow, MD  valACYclovir (VALTREX) 500 MG tablet Take 1 tablet (500 mg total) by mouth 2 (two) times daily as needed. 01/04/18   McLean-Scocuzza, Nino Glow, MD    Allergies as of 08/16/2018  . (No Known Allergies)    Family History  Problem Relation Age of Onset  . Arthritis Mother   . Diabetes Mother   . Diabetes Father   . Cancer Brother   . Cancer Maternal Grandfather   . Breast cancer Maternal Aunt   . Ovarian cancer Maternal Aunt   . Colon cancer Neg Hx     Social History    Socioeconomic History  . Marital status: Single    Spouse name: Not on file  . Number of children: Not on file  . Years of education: Not on file  . Highest education level: Not on file  Occupational History  . Not on file  Social Needs  . Financial resource strain: Not on file  . Food insecurity:    Worry: Not on file    Inability: Not on file  . Transportation needs:    Medical: Not on file    Non-medical: Not on file  Tobacco Use  . Smoking status: Former Smoker    Last attempt to quit: 2007    Years since quitting: 13.1  . Smokeless tobacco: Never Used  Substance and Sexual Activity  . Alcohol use: Yes    Alcohol/week: 2.0 standard drinks    Types: 2 Glasses of wine per week    Comment: per 2 weeks   . Drug use: No  . Sexual activity: Yes    Birth control/protection: Condom  Lifestyle  . Physical activity:    Days per week: Not on file    Minutes per session: Not on file  . Stress: Not on file  Relationships  . Social connections:    Talks on phone: Not on file    Gets together: Not on file    Attends religious service: Not on file  Active member of club or organization: Not on file    Attends meetings of clubs or organizations: Not on file    Relationship status: Not on file  . Intimate partner violence:    Fear of current or ex partner: Not on file    Emotionally abused: Not on file    Physically abused: Not on file    Forced sexual activity: Not on file  Other Topics Concern  . Not on file  Social History Narrative   1 daughter     Review of Systems: See HPI, otherwise negative ROS  Physical Exam: BP 122/84   Pulse 74   Temp 98.9 F (37.2 C) (Oral)   Resp 18   Ht 5\' 8"  (1.727 m)   Wt 93 kg   SpO2 100%   BMI 31.17 kg/m  General:   Alert,  pleasant and cooperative in NAD Head:  Normocephalic and atraumatic. Neck:  Supple; no masses or thyromegaly. Lungs:  Clear throughout to auscultation.    Heart:  Regular rate and rhythm. Abdomen:   Soft, nontender and nondistended. Normal bowel sounds, without guarding, and without rebound.   Neurologic:  Alert and  oriented x4;  grossly normal neurologically.  Impression/Plan: Nichole Rodriguez is here for an endoscopy and colonoscopy to be performed for GERD, screen for barrett's, colon cancer screening  Risks, benefits, limitations, and alternatives regarding  endoscopy and colonoscopy have been reviewed with the patient.  Questions have been answered.  All parties agreeable.   Sherri Sear, MD  08/28/2018, 2:03 PM

## 2018-08-28 NOTE — Anesthesia Preprocedure Evaluation (Signed)
Anesthesia Evaluation  Patient identified by MRN, date of birth, ID band Patient awake    Reviewed: Allergy & Precautions, NPO status , Patient's Chart, lab work & pertinent test results  History of Anesthesia Complications Negative for: history of anesthetic complications  Airway Mallampati: II  TM Distance: >3 FB Neck ROM: Full    Dental no notable dental hx.    Pulmonary neg sleep apnea, neg COPD, former smoker,    breath sounds clear to auscultation- rhonchi (-) wheezing      Cardiovascular hypertension, Pt. on medications (-) CAD, (-) Past MI, (-) Cardiac Stents and (-) CABG  Rhythm:Regular Rate:Normal - Systolic murmurs and - Diastolic murmurs    Neuro/Psych  Headaches, negative psych ROS   GI/Hepatic negative GI ROS, Neg liver ROS,   Endo/Other  negative endocrine ROSneg diabetes  Renal/GU negative Renal ROS     Musculoskeletal negative musculoskeletal ROS (+)   Abdominal (+) + obese,   Peds  Hematology negative hematology ROS (+)   Anesthesia Other Findings Past Medical History: No date: Fibroids No date: Genital herpes No date: Genital warts No date: Hyperlipidemia No date: Hypertension No date: Migraine   Reproductive/Obstetrics                             Anesthesia Physical Anesthesia Plan  ASA: II  Anesthesia Plan: General   Post-op Pain Management:    Induction: Intravenous  PONV Risk Score and Plan: 2 and Propofol infusion  Airway Management Planned: Natural Airway  Additional Equipment:   Intra-op Plan:   Post-operative Plan:   Informed Consent: I have reviewed the patients History and Physical, chart, labs and discussed the procedure including the risks, benefits and alternatives for the proposed anesthesia with the patient or authorized representative who has indicated his/her understanding and acceptance.     Dental advisory given  Plan Discussed  with: CRNA and Anesthesiologist  Anesthesia Plan Comments:         Anesthesia Quick Evaluation

## 2018-08-28 NOTE — Op Note (Signed)
[facility] Gastroenterology Patient Name: Nichole Rodriguez Procedure Date: 08/28/2018 2:07 PM MRN: [id number] Account #: [id number] Date of Birth: 27-Nov-1965 Admit Type: Outpatient Age: 53 Room: [facility] ENDO ROOM 4 Gender: Female Note Status: Finalized Procedure:            Upper GI endoscopy Indications:          Follow-up of esophageal reflux, Gastro-esophageal                        reflux disease Providers:            Lin Landsman MD, MD Medicines:            Monitored Anesthesia Care Complications:        No immediate complications. Estimated blood loss:                        Minimal. Procedure:            Pre-Anesthesia Assessment:                       - Prior to the procedure, a History and Physical was                        performed, and patient medications and allergies were                        reviewed. The patient is competent. The risks and                        benefits of the procedure and the sedation options and                        risks were discussed with the patient. All questions                        were answered and informed consent was obtained.                        Patient identification and proposed procedure were                        verified by the physician, the nurse, the                        anesthesiologist, the anesthetist and the technician in                        the pre-procedure area in the procedure room in the                        endoscopy suite. Mental Status Examination: alert and                        oriented. Airway Examination: normal oropharyngeal                        airway and neck mobility. Respiratory Examination:                        clear to auscultation. CV Examination: normal.  Prophylactic Antibiotics: The patient does not require                        prophylactic antibiotics. Prior Anticoagulants: The                        patient has taken no previous  anticoagulant or                        antiplatelet agents. ASA Grade Assessment: II - A                        patient with mild systemic disease. After reviewing the                        risks and benefits, the patient was deemed in                        satisfactory condition to undergo the procedure. The                        anesthesia plan was to use monitored anesthesia care                        (MAC). Immediately prior to administration of                        medications, the patient was re-assessed for adequacy                        to receive sedatives. The heart rate, respiratory rate,                        oxygen saturations, blood pressure, adequacy of                        pulmonary ventilation, and response to care were                        monitored throughout the procedure. The physical status                        of the patient was re-assessed after the procedure.                       After obtaining informed consent, the endoscope was                        passed under direct vision. Throughout the procedure,                        the patient's blood pressure, pulse, and oxygen                        saturations were monitored continuously. The Endoscope                        was introduced through the mouth, and advanced to the  second part of duodenum. The upper GI endoscopy was                        accomplished without difficulty. The patient tolerated                        the procedure fairly well. Findings:      One non-bleeding superficial duodenal ulcer with a clean ulcer base       (Forrest Class III) was found in the duodenal bulb. The lesion was 5 mm       in largest dimension.      The second portion of the duodenum was normal.      Diffuse mildly erythematous mucosa without bleeding was found in the       gastric body. Biopsies were taken with a cold forceps for Helicobacter       pylori testing.      The  incisura and gastric antrum were normal. Biopsies were taken with a       cold forceps for Helicobacter pylori testing.      A small hiatal hernia was present.      Esophagogastric landmarks were identified: the gastroesophageal junction       was found at 34 cm from the incisors.      The gastroesophageal junction and examined esophagus were normal.       Biopsies were taken with a cold forceps for histology. Impression:           - One non-bleeding duodenal ulcer with a clean ulcer                        base (Forrest Class III).                       - Normal second portion of the duodenum.                       - Erythematous mucosa in the gastric body. Biopsied.                       - Normal incisura and antrum. Biopsied.                       - Small hiatal hernia.                       - Esophagogastric landmarks identified.                       - Normal gastroesophageal junction and esophagus.                        Biopsied. Recommendation:       - Await pathology results.                       - Follow an antireflux regimen.                       - Use Prilosec (omeprazole) 40 mg PO BID for 3 months. Procedure Code(s):    --- Professional ---                       [phone], Esophagogastroduodenoscopy, flexible,  transoral;                        with biopsy, single or multiple Diagnosis Code(s):    --- Professional ---                       K26.9, Duodenal ulcer, unspecified as acute or chronic,                        without hemorrhage or perforation                       K31.89, Other diseases of stomach and duodenum                       K44.9, Diaphragmatic hernia without obstruction or                        gangrene                       K21.9, Gastro-esophageal reflux disease without                        esophagitis CPT copyright 2018 American Medical Association. All rights reserved. The codes documented in this report are preliminary and upon coder review may  be  revised to meet current compliance requirements. Dr. Ulyess Mort Lin Landsman MD, MD 08/28/2018 2:31:47 PM This report has been signed electronically. Number of Addenda: 0 Note Initiated On: 08/28/2018 2:07 PM      [facility]

## 2018-08-28 NOTE — Transfer of Care (Signed)
Immediate Anesthesia Transfer of Care Note  Patient: Nichole Rodriguez  Procedure(s) Performed: ESOPHAGOGASTRODUODENOSCOPY (EGD) WITH PROPOFOL (N/A ) COLONOSCOPY WITH PROPOFOL (N/A )  Patient Location: PACU  Anesthesia Type:General  Level of Consciousness: sedated  Airway & Oxygen Therapy: Patient Spontanous Breathing and Patient connected to nasal cannula oxygen  Post-op Assessment: Report given to RN and Post -op Vital signs reviewed and stable  Post vital signs: Reviewed and stable  Last Vitals:  Vitals Value Taken Time  BP 127/83 08/28/2018  3:00 PM  Temp    Pulse 82 08/28/2018  3:04 PM  Resp 18 08/28/2018  3:04 PM  SpO2 99 % 08/28/2018  3:04 PM  Vitals shown include unvalidated device data.  Last Pain:  Vitals:   08/28/18 1450  TempSrc: Tympanic  PainSc: Asleep         Complications: No apparent anesthesia complications

## 2018-08-28 NOTE — Anesthesia Post-op Follow-up Note (Signed)
Anesthesia QCDR form completed.        

## 2018-08-29 ENCOUNTER — Encounter: Payer: Self-pay | Admitting: Gastroenterology

## 2018-08-29 NOTE — Anesthesia Postprocedure Evaluation (Signed)
Anesthesia Post Note  Patient: Nichole Rodriguez  Procedure(s) Performed: ESOPHAGOGASTRODUODENOSCOPY (EGD) WITH PROPOFOL (N/A ) COLONOSCOPY WITH PROPOFOL (N/A )  Patient location during evaluation: Endoscopy Anesthesia Type: General Level of consciousness: awake and alert and oriented Pain management: pain level controlled Vital Signs Assessment: post-procedure vital signs reviewed and stable Respiratory status: spontaneous breathing, nonlabored ventilation and respiratory function stable Cardiovascular status: blood pressure returned to baseline and stable Postop Assessment: no signs of nausea or vomiting Anesthetic complications: no     Last Vitals:  Vitals:   08/28/18 1500 08/28/18 1510  BP: 127/83 125/87  Pulse: 87 84  Resp: (!) 21 19  Temp:    SpO2: 99% 99%    Last Pain:  Vitals:   08/29/18 0738  TempSrc:   PainSc: 0-No pain                 [name]

## 2018-08-30 DIAGNOSIS — L738 Other specified follicular disorders: Secondary | ICD-10-CM | POA: Diagnosis not present

## 2018-08-30 LAB — SURGICAL PATHOLOGY

## 2018-09-03 ENCOUNTER — Other Ambulatory Visit: Payer: Self-pay

## 2018-09-03 ENCOUNTER — Telehealth: Payer: Self-pay | Admitting: Gastroenterology

## 2018-09-03 DIAGNOSIS — A048 Other specified bacterial intestinal infections: Secondary | ICD-10-CM

## 2018-09-03 MED ORDER — AMOXICILLIN 500 MG PO TABS: 1000.0000 mg | ORAL_TABLET | Freq: Two times a day (BID) | ORAL | 0 refills | Status: AC

## 2018-09-03 MED ORDER — OMEPRAZOLE 40 MG PO CPDR: 40.0000 mg | DELAYED_RELEASE_CAPSULE | Freq: Two times a day (BID) | ORAL | 0 refills | Status: DC

## 2018-09-03 MED ORDER — CLARITHROMYCIN 500 MG PO TABS: 500.0000 mg | ORAL_TABLET | Freq: Two times a day (BID) | ORAL | 0 refills | Status: AC

## 2018-09-03 NOTE — Telephone Encounter (Signed)
Patient is returning Nichole Rodriguez's call about results.

## 2018-09-03 NOTE — Progress Notes (Signed)
LVM notifying pt of results and medication has been sent to pharmacy

## 2018-09-16 ENCOUNTER — Other Ambulatory Visit: Payer: Self-pay | Admitting: Internal Medicine

## 2018-09-16 DIAGNOSIS — I10 Essential (primary) hypertension: Secondary | ICD-10-CM

## 2018-09-16 MED ORDER — LISINOPRIL-HYDROCHLOROTHIAZIDE 10-12.5 MG PO TABS: 1.0000 | ORAL_TABLET | Freq: Every day | ORAL | 3 refills | Status: DC

## 2018-09-28 DIAGNOSIS — L738 Other specified follicular disorders: Secondary | ICD-10-CM | POA: Diagnosis not present

## 2018-09-28 DIAGNOSIS — L668 Other cicatricial alopecia: Secondary | ICD-10-CM | POA: Diagnosis not present

## 2018-11-02 DIAGNOSIS — L668 Other cicatricial alopecia: Secondary | ICD-10-CM | POA: Diagnosis not present

## 2018-11-20 ENCOUNTER — Encounter: Payer: Self-pay | Admitting: Internal Medicine

## 2018-11-20 ENCOUNTER — Ambulatory Visit (INDEPENDENT_AMBULATORY_CARE_PROVIDER_SITE_OTHER): Payer: BLUE CROSS/BLUE SHIELD | Admitting: Internal Medicine

## 2018-11-20 DIAGNOSIS — I10 Essential (primary) hypertension: Secondary | ICD-10-CM

## 2018-11-20 DIAGNOSIS — R609 Edema, unspecified: Secondary | ICD-10-CM | POA: Diagnosis not present

## 2018-11-20 DIAGNOSIS — L309 Dermatitis, unspecified: Secondary | ICD-10-CM

## 2018-11-20 DIAGNOSIS — M5441 Lumbago with sciatica, right side: Secondary | ICD-10-CM

## 2018-11-20 MED ORDER — HYDROCORTISONE 2.5 % EX OINT: TOPICAL_OINTMENT | Freq: Two times a day (BID) | CUTANEOUS | 0 refills | Status: DC

## 2018-11-20 NOTE — Progress Notes (Signed)
Virtual Visit via Video Note  I connected with Nichole Rodriguez   on 11/20/18 at 10:05 AM EDT by a video enabled telemedicine application and verified that I am speaking with the correct person using two identifiers.  Location patient: home Location provider:work  Persons participating in the virtual visit: patient, provider  I discussed the limitations of evaluation and management by telemedicine and the availability of in person appointments. The patient expressed understanding and agreed to proceed.   HPI: 1. Low back pain w/in the last 4-5 months 7-8/10 advil helped and elavil 10 mg qhs helped 1 time back pain worse since gaining weight to 220 lbs up 10-15 lbs  Pain radiates to right leg  2. C/o bumps not itchy or painful around ears (I.e helix and inside ears and before ears) and areas feel dry. Using new soap oil of olay not normal mary K. She also notes she had injections of ? Substance possibly steroids by dermatology Dr. Keene Breath in 07/2018, 08/2018 and 3 or 10/2018 and noted the bumps after that. She was wondering if could be herpes outbreak  3. Edema in legs with increased salt intake tried to elevated legs ankles swollen worse at night will try otc compression socks  4. HTN not checking BP but not seeing silver dots so thinks BP controlled    ROS: See pertinent positives and negatives per HPI.  Past Medical History:  Diagnosis Date  . Fibroids   . Genital herpes   . Genital warts   . Hyperlipidemia   . Hypertension   . Migraine     Past Surgical History:  Procedure Laterality Date  . COLONOSCOPY WITH PROPOFOL N/A 08/28/2018   Procedure: COLONOSCOPY WITH PROPOFOL;  Surgeon: Lin Landsman, MD;  Location: [facility] ENDOSCOPY;  Service: Gastroenterology;  Laterality: N/A;  . ECTOPIC PREGNANCY SURGERY Left   . EMBOLIZATION     For fibroid  . ESOPHAGOGASTRODUODENOSCOPY (EGD) WITH PROPOFOL N/A 08/28/2018   Procedure: ESOPHAGOGASTRODUODENOSCOPY (EGD) WITH PROPOFOL;  Surgeon:  Lin Landsman, MD;  Location: [place];  Service: Gastroenterology;  Laterality: N/A;    Family History  Problem Relation Age of Onset  . Arthritis Mother   . Diabetes Mother   . Diabetes Father   . Cancer Brother   . Cancer Maternal Grandfather   . Breast cancer Maternal Aunt   . Ovarian cancer Maternal Aunt   . 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  amitriptyline (ELAVIL) 10 MG tablet, Take 1 tablet (10 mg total) by mouth at bedtime., Disp: 90 tablet, Rfl: 3 .  fluticasone (FLONASE) 50 MCG/ACT nasal spray, Place 1-2 sprays into both nostrils daily., Disp: 16 g, Rfl: 12 .  hydrocortisone 2.5 % ointment, Apply topically 2 (two) times daily. Prn ears, Disp: 60 g, Rfl: 0 .  lisinopril-hydrochlorothiazide (PRINZIDE,ZESTORETIC) 10-12.5 MG tablet, Take 1 tablet by mouth daily., Disp: 90 tablet, Rfl: 3 .  loratadine (CLARITIN) 10 MG tablet, Take 1 tablet (10 mg total) by mouth daily., Disp: 90 tablet, Rfl: 3 .  omeprazole (PRILOSEC) 40 MG capsule, Take 1 capsule (40 mg total) by mouth 2 (two) times daily before a meal., Disp: 180 capsule, Rfl: 1 .  omeprazole (PRILOSEC) 40 MG capsule, Take 1 capsule (40 mg total) by mouth 2 (two) times daily for 14 days., Disp: 28 capsule, Rfl: 0 .  sodium chloride (OCEAN) 0.65 % SOLN nasal spray, Place 2 sprays into both nostrils  as needed for congestion., Disp: 1 Bottle, Rfl: 12 .  valACYclovir (VALTREX) 500 MG tablet, Take 1 tablet (500 mg total) by mouth 2 (two) times daily as needed., Disp: 90 tablet, Rfl: 3  EXAM:  VITALS per patient if applicable:  GENERAL: alert, oriented, appears well and in no acute distress  HEENT: atraumatic, conjunttiva clear, no obvious abnormalities on inspection of external nose and ears  NECK: normal movements of the head and neck  LUNGS: on inspection no signs of respiratory distress, breathing rate appears normal, no obvious gross SOB,  gasping or wheezing  CV: no obvious cyanosis  MS: moves all visible extremities without noticeable abnormality  PSYCH/NEURO: pleasant and cooperative, no obvious depression or anxiety, speech and thought processing grossly intact  ASSESSMENT AND PLAN:  Discussed the following assessment and plan:  Right-sided low back pain with right-sided sciatica, unspecified chronicity - Plan: DG Lumbar Spine Complete Prn Tylenol  Declines muscle relaxers for now  Prn heat  Disc back stretches [facility] or web md to review    Dermatitis - Plan: hydrocortisone 2.5 % ointment to ears/face rec cetaphil/cerave or dove soap  Essential hypertension-cont meds reduce soap fasting labs 11/22/2018 at 10:15 am   Ankle edema  -reduce salt trial of otc compression socks  -check D dimer r/o DVT low susp.  HM Had flu shot 10//8/19  Tdap had Hep B 3/3 check status   Pap neg 07/05/17 neg hpvDr. Defrancesco mammo neg 01/29/18 new order in  Colonoscopy 08/28/18 normal   sch fasting labs   Former smoker quit 2005/10/11 smoked from age 54 to 10/11/2005 1 ppd FH brother died lung cancer     I discussed the assessment and treatment plan with the patient. The patient was provided an opportunity to ask questions and all were answered. The patient agreed with the plan and demonstrated an understanding of the instructions.   The patient was advised to call back or seek an in-person evaluation if the symptoms worsen or if the condition fails to improve as anticipated.  Time spent 25 minutes  Delorise Jackson, MD

## 2018-11-20 NOTE — Addendum Note (Signed)
Addended by: Orland Mustard on: 11/20/2018 10:38 AM   Modules accepted: Orders

## 2018-11-22 ENCOUNTER — Other Ambulatory Visit: Payer: Self-pay

## 2018-11-22 ENCOUNTER — Other Ambulatory Visit (INDEPENDENT_AMBULATORY_CARE_PROVIDER_SITE_OTHER): Payer: BLUE CROSS/BLUE SHIELD

## 2018-11-22 ENCOUNTER — Ambulatory Visit (HOSPITAL_COMMUNITY)
Admission: RE | Admit: 2018-11-22 | Discharge: 2018-11-22 | Disposition: A | Discharge status: Home / Self Care | Payer: BLUE CROSS/BLUE SHIELD | Source: Ambulatory Visit | Attending: Internal Medicine | Admitting: Internal Medicine

## 2018-11-22 ENCOUNTER — Other Ambulatory Visit: Payer: Self-pay | Admitting: Internal Medicine

## 2018-11-22 ENCOUNTER — Ambulatory Visit (INDEPENDENT_AMBULATORY_CARE_PROVIDER_SITE_OTHER): Payer: BLUE CROSS/BLUE SHIELD

## 2018-11-22 DIAGNOSIS — M545 Low back pain, unspecified: Secondary | ICD-10-CM

## 2018-11-22 DIAGNOSIS — M5441 Lumbago with sciatica, right side: Secondary | ICD-10-CM

## 2018-11-22 DIAGNOSIS — R600 Localized edema: Secondary | ICD-10-CM | POA: Diagnosis not present

## 2018-11-23 ENCOUNTER — Telehealth: Payer: Self-pay | Admitting: Internal Medicine

## 2018-11-23 NOTE — Telephone Encounter (Signed)
D dimer normal negative blood clot   TMS

## 2018-11-23 NOTE — Telephone Encounter (Signed)
Patient was informed of results.  Patient understood and no questions, comments, or concerns at this time.  

## 2018-11-27 LAB — OTHER LAB TEST
FLAG:: NORMAL
Miscellaneous Test Results: 300
PRICE:: 146

## 2018-12-17 ENCOUNTER — Encounter: Payer: Self-pay | Admitting: Internal Medicine

## 2018-12-19 ENCOUNTER — Other Ambulatory Visit: Payer: Self-pay | Admitting: Internal Medicine

## 2018-12-19 DIAGNOSIS — L309 Dermatitis, unspecified: Secondary | ICD-10-CM

## 2018-12-19 MED ORDER — HYDROCORTISONE 2.5 % EX OINT: TOPICAL_OINTMENT | Freq: Two times a day (BID) | CUTANEOUS | 11 refills | Status: DC

## 2018-12-25 DIAGNOSIS — R05 Cough: Secondary | ICD-10-CM | POA: Diagnosis not present

## 2018-12-25 DIAGNOSIS — R0602 Shortness of breath: Secondary | ICD-10-CM | POA: Diagnosis not present

## 2018-12-25 DIAGNOSIS — Z1159 Encounter for screening for other viral diseases: Secondary | ICD-10-CM | POA: Diagnosis not present

## 2019-01-01 ENCOUNTER — Other Ambulatory Visit: Payer: Self-pay | Admitting: Internal Medicine

## 2019-01-01 MED ORDER — VALACYCLOVIR HCL 500 MG PO TABS: 500.0000 mg | ORAL_TABLET | Freq: Two times a day (BID) | ORAL | 3 refills | Status: DC | PRN

## 2019-02-14 DIAGNOSIS — L738 Other specified follicular disorders: Secondary | ICD-10-CM | POA: Diagnosis not present

## 2019-02-28 ENCOUNTER — Other Ambulatory Visit: Payer: Self-pay

## 2019-02-28 ENCOUNTER — Encounter: Payer: Self-pay | Admitting: Internal Medicine

## 2019-02-28 ENCOUNTER — Ambulatory Visit (INDEPENDENT_AMBULATORY_CARE_PROVIDER_SITE_OTHER): Payer: Self-pay | Admitting: Internal Medicine

## 2019-02-28 VITALS — Ht 68.0 in | Wt 225.0 lb

## 2019-02-28 DIAGNOSIS — J309 Allergic rhinitis, unspecified: Secondary | ICD-10-CM

## 2019-02-28 DIAGNOSIS — Z0184 Encounter for antibody response examination: Secondary | ICD-10-CM

## 2019-02-28 DIAGNOSIS — R7303 Prediabetes: Secondary | ICD-10-CM

## 2019-02-28 DIAGNOSIS — Z1389 Encounter for screening for other disorder: Secondary | ICD-10-CM

## 2019-02-28 DIAGNOSIS — Z13220 Encounter for screening for lipoid disorders: Secondary | ICD-10-CM

## 2019-02-28 DIAGNOSIS — R519 Headache, unspecified: Secondary | ICD-10-CM | POA: Insufficient documentation

## 2019-02-28 DIAGNOSIS — Z1329 Encounter for screening for other suspected endocrine disorder: Secondary | ICD-10-CM

## 2019-02-28 DIAGNOSIS — R609 Edema, unspecified: Secondary | ICD-10-CM

## 2019-02-28 DIAGNOSIS — R51 Headache: Secondary | ICD-10-CM

## 2019-02-28 DIAGNOSIS — B009 Herpesviral infection, unspecified: Secondary | ICD-10-CM

## 2019-02-28 DIAGNOSIS — E559 Vitamin D deficiency, unspecified: Secondary | ICD-10-CM

## 2019-02-28 DIAGNOSIS — I10 Essential (primary) hypertension: Secondary | ICD-10-CM

## 2019-02-28 DIAGNOSIS — Z1159 Encounter for screening for other viral diseases: Secondary | ICD-10-CM

## 2019-02-28 HISTORY — DX: Headache, unspecified: R51.9

## 2019-02-28 HISTORY — DX: Herpesviral infection, unspecified: B00.9

## 2019-02-28 MED ORDER — FUROSEMIDE 20 MG PO TABS: 20.0000 mg | ORAL_TABLET | Freq: Every day | ORAL | 0 refills | Status: DC | PRN

## 2019-02-28 MED ORDER — LORATADINE 10 MG PO TABS: 10.0000 mg | ORAL_TABLET | Freq: Every day | ORAL | 3 refills | Status: DC | PRN

## 2019-02-28 MED ORDER — FLUTICASONE PROPIONATE 50 MCG/ACT NA SUSP: 1.0000 | Freq: Every day | NASAL | 12 refills | Status: DC

## 2019-02-28 MED ORDER — VALACYCLOVIR HCL 500 MG PO TABS: 500.0000 mg | ORAL_TABLET | Freq: Every day | ORAL | 3 refills | Status: DC

## 2019-02-28 MED ORDER — LISINOPRIL-HYDROCHLOROTHIAZIDE 10-12.5 MG PO TABS: 1.0000 | ORAL_TABLET | Freq: Every day | ORAL | 3 refills | Status: DC

## 2019-02-28 NOTE — Patient Instructions (Signed)
Please call [place] and schedule your mammogram

## 2019-02-28 NOTE — Addendum Note (Signed)
Addended by: Orland Mustard on: 02/28/2019 12:38 PM   Modules accepted: Level of Service

## 2019-02-28 NOTE — Progress Notes (Addendum)
Virtual Visit via Video Note  I connected with Nichole Rodriguez   on 02/28/19 at 10:30 AM EDT by a video enabled telemedicine application and verified that I am speaking with the correct person using two identifiers.  Location patient: home Location provider:work or home office Persons participating in the virtual visit: patient, provider  I discussed the limitations of evaluation and management by telemedicine and the availability of in person appointments. The patient expressed understanding and agreed to proceed.   HPI: 1. B/l ankle edema d dimer negative 11/2018 sitting a lot makes worse and sitting 6-8 hrs with work elevation helps at night and has tried to reduce fried foods and salt  2. Rash to ears and hc 2.5 % oint helps  3. 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 Obesity will try to work out bought elliptical wt currently 225 from 205  5. HTN she is not checking her bp on lis hctz 10-12.5  6. HSV with 2x per month outbreaks on right hand and nose w/in the last 6 months takes valtrex prn   ROS: See pertinent positives and negatives per HPI.  Past Medical History:  Diagnosis Date  . Fibroids   . Genital herpes   . Genital warts   . Hyperlipidemia   . Hypertension   . Migraine     Past Surgical History:  Procedure Laterality Date  . COLONOSCOPY WITH PROPOFOL N/A 08/28/2018   Procedure: COLONOSCOPY WITH PROPOFOL;  Surgeon: Lin Landsman, MD;  Location: [facility] ENDOSCOPY;  Service: Gastroenterology;  Laterality: N/A;  . ECTOPIC PREGNANCY SURGERY Left   . EMBOLIZATION     For fibroid  . ESOPHAGOGASTRODUODENOSCOPY (EGD) WITH PROPOFOL N/A 08/28/2018   Procedure: ESOPHAGOGASTRODUODENOSCOPY (EGD) WITH PROPOFOL;  Surgeon: Lin Landsman, MD;  Location: [place];  Service: Gastroenterology;  Laterality: N/A;    Family History  Problem Relation Age of Onset  . Arthritis Mother   . Diabetes  Mother   . Diabetes Father   . Cancer Brother   . Cancer Maternal Grandfather   . Breast cancer Maternal Aunt   . Ovarian cancer Maternal Aunt   . Colon cancer Neg Hx     SOCIAL HX: lives at home    Current Outpatient Medications:  .  amitriptyline (ELAVIL) 10 MG tablet, Take 1 tablet (10 mg total) by mouth at bedtime., Disp: 90 tablet, Rfl: 3 .  fluticasone (FLONASE) 50 MCG/ACT nasal spray, Place 1-2 sprays into both nostrils daily., Disp: 16 g, Rfl: 12 .  hydrocortisone 2.5 % ointment, Apply topically 2 (two) times daily. Prn ears, Disp: 60 g, Rfl: 11 .  lisinopril-hydrochlorothiazide (ZESTORETIC) 10-12.5 MG tablet, Take 1 tablet by mouth daily., Disp: 90 tablet, Rfl: 3 .  loratadine (CLARITIN) 10 MG tablet, Take 1 tablet (10 mg total) by mouth daily as needed for allergies., Disp: 90 tablet, Rfl: 3 .  sodium chloride (OCEAN) 0.65 % SOLN nasal spray, Place 2 sprays into both nostrils as needed for congestion., Disp: 1 Bottle, Rfl: 12 .  valACYclovir (VALTREX) 500 MG tablet, Take 1 tablet (500 mg total) by mouth daily. For prevention bid x 3-7 days outbreak, Disp: 90 tablet, Rfl: 3 .  furosemide (LASIX) 20 MG tablet, Take 1 tablet (20 mg total) by mouth daily as needed., Disp: 90 tablet, Rfl: 0 .  omeprazole (PRILOSEC) 40 MG capsule, Take 1 capsule (40 mg total) by mouth 2 (two) times daily before a meal.,  Disp: 180 capsule, Rfl: 1 .  omeprazole (PRILOSEC) 40 MG capsule, Take 1 capsule (40 mg total) by mouth 2 (two) times daily for 14 days., Disp: 28 capsule, Rfl: 0  EXAM:  VITALS per patient if applicable:  GENERAL: alert, oriented, appears well and in no acute distress  HEENT: atraumatic, conjunttiva clear, no obvious abnormalities on inspection of external nose and ears  NECK: normal movements of the head and neck  LUNGS: on inspection no signs of respiratory distress, breathing rate appears normal, no obvious gross SOB, gasping or wheezing  CV: no obvious cyanosis  MS:  moves all visible extremities without noticeable abnormality  PSYCH/NEURO: pleasant and cooperative, no obvious depression or anxiety, speech and thought processing grossly intact  ASSESSMENT AND PLAN:  Discussed the following assessment and plan:  Edema, unspecified type - Plan: furosemide (LASIX) 20 MG tablet daily prn  If not better call back in 2 weeks and consider echo and CT ab/pelvis r/o GU etiology as cause  Rx compression stocking   Essential hypertension - Plan: Comprehensive metabolic panel, CBC w/Diff, Lipid panel, lisinopril-hydrochlorothiazide (ZESTORETIC) 10-12.5 MG tablet sch fasting labs  Prediabetes - Plan: Hemoglobin A1c  Allergic rhinitis, unspecified seasonality, unspecified trigger - Plan: loratadine (CLARITIN) 10 MG tablet, fluticasone (FLONASE) 50 MCG/ACT nasal spray  HSV infection - Plan: valACYclovir (VALTREX) 500 MG tablet qd and bid prn 3-7 days outbreak   Nonintractable headache, unspecified chronicity pattern, unspecified headache type - Plan: prn excedrin, reduce stress rec take amitriptyline 10 mg qhs   HM Had flu shot10//8/19 Tdap had Hep B3/3check status   Pap neg 07/05/17 neg hpvDr. Defrancesco mammo neg 01/29/18 new order in  Colonoscopy 08/28/18 normal   sch fasting labs   I discussed the assessment and treatment plan with the patient. The patient was provided an opportunity to ask questions and all were answered. The patient agreed with the plan and demonstrated an understanding of the instructions.   The patient was advised to call back or seek an in-person evaluation if the symptoms worsen or if the condition fails to improve as anticipated.  Time spent 25 minutes  Delorise Jackson, MD

## 2019-03-01 ENCOUNTER — Telehealth: Payer: Self-pay | Admitting: Internal Medicine

## 2019-03-01 NOTE — Telephone Encounter (Signed)
I called and left vm to call ofc to sch Return in about 3 months (around 05/31/2019) for sch fasting labs asap f/u in 3 months.

## 2019-03-21 ENCOUNTER — Ambulatory Visit: Payer: Self-pay

## 2019-03-21 ENCOUNTER — Other Ambulatory Visit: Payer: Self-pay

## 2019-03-22 ENCOUNTER — Other Ambulatory Visit: Payer: Self-pay

## 2019-03-29 ENCOUNTER — Ambulatory Visit: Payer: Self-pay

## 2019-06-11 ENCOUNTER — Other Ambulatory Visit: Payer: Self-pay

## 2019-06-12 ENCOUNTER — Encounter: Payer: Self-pay | Admitting: Internal Medicine

## 2019-06-12 ENCOUNTER — Ambulatory Visit (INDEPENDENT_AMBULATORY_CARE_PROVIDER_SITE_OTHER): Payer: BC Managed Care – PPO | Admitting: Internal Medicine

## 2019-06-12 VITALS — Ht 68.0 in | Wt 230.0 lb

## 2019-06-12 DIAGNOSIS — M545 Low back pain, unspecified: Secondary | ICD-10-CM

## 2019-06-12 DIAGNOSIS — G4700 Insomnia, unspecified: Secondary | ICD-10-CM

## 2019-06-12 DIAGNOSIS — E669 Obesity, unspecified: Secondary | ICD-10-CM

## 2019-06-12 DIAGNOSIS — R7303 Prediabetes: Secondary | ICD-10-CM

## 2019-06-12 MED ORDER — AMITRIPTYLINE HCL 10 MG PO TABS: 10.0000 mg | ORAL_TABLET | Freq: Every day | ORAL | 3 refills | Status: DC

## 2019-06-12 MED ORDER — METHOCARBAMOL 500 MG PO TABS: 500.0000 mg | ORAL_TABLET | Freq: Every evening | ORAL | 0 refills | Status: DC | PRN

## 2019-06-12 NOTE — Patient Instructions (Addendum)
Back Exercises The following exercises strengthen the muscles that help to support the trunk and back. They also help to keep the lower back flexible. Doing these exercises can help to prevent back pain or lessen existing pain.  If you have back pain or discomfort, try doing these exercises 2-3 times each day or as told by your health care provider.  As your pain improves, do them once each day, but increase the number of times that you repeat the steps for each exercise (do more repetitions).  To prevent the recurrence of back pain, continue to do these exercises once each day or as told by your health care provider. Do exercises exactly as told by your health care provider and adjust them as directed. It is normal to feel mild stretching, pulling, tightness, or discomfort as you do these exercises, but you should stop right away if you feel sudden pain or your pain gets worse. Exercises Single knee to chest Repeat these steps 3-5 times for each leg: 1. Lie on your back on a firm bed or the floor with your legs extended. 2. Bring one knee to your chest. Your other leg should stay extended and in contact with the floor. 3. Hold your knee in place by grabbing your knee or thigh with both hands and hold. 4. Pull on your knee until you feel a gentle stretch in your lower back or buttocks. 5. Hold the stretch for 10-30 seconds. 6. Slowly release and straighten your leg. Pelvic tilt Repeat these steps 5-10 times: 1. Lie on your back on a firm bed or the floor with your legs extended. 2. Bend your knees so they are pointing toward the ceiling and your feet are flat on the floor. 3. Tighten your lower abdominal muscles to press your lower back against the floor. This motion will tilt your pelvis so your tailbone points up toward the ceiling instead of pointing to your feet or the floor. 4. With gentle tension and even breathing, hold this position for 5-10 seconds. Cat-cow Repeat these steps until  your lower back becomes more flexible: 1. Get into a hands-and-knees position on a firm surface. Keep your hands under your shoulders, and keep your knees under your hips. You may place padding under your knees for comfort. 2. Let your head hang down toward your chest. Contract your abdominal muscles and point your tailbone toward the floor so your lower back becomes rounded like the back of a cat. 3. Hold this position for 5 seconds. 4. Slowly lift your head, let your abdominal muscles relax and point your tailbone up toward the ceiling so your back forms a sagging arch like the back of a cow. 5. Hold this position for 5 seconds.  Press-ups Repeat these steps 5-10 times: 1. Lie on your abdomen (face-down) on the floor. 2. Place your palms near your head, about shoulder-width apart. 3. Keeping your back as relaxed as possible and keeping your hips on the floor, slowly straighten your arms to raise the top half of your body and lift your shoulders. Do not use your back muscles to raise your upper torso. You may adjust the placement of your hands to make yourself more comfortable. 4. Hold this position for 5 seconds while you keep your back relaxed. 5. Slowly return to lying flat on the floor.  Bridges Repeat these steps 10 times: 1. Lie on your back on a firm surface. 2. Bend your knees so they are pointing toward the ceiling and   your feet are flat on the floor. Your arms should be flat at your sides, next to your body. 3. Tighten your buttocks muscles and lift your buttocks off the floor until your waist is at almost the same height as your knees. You should feel the muscles working in your buttocks and the back of your thighs. If you do not feel these muscles, slide your feet 1-2 inches farther away from your buttocks. 4. Hold this position for 3-5 seconds. 5. Slowly lower your hips to the starting position, and allow your buttocks muscles to relax completely. If this exercise is too easy, try  doing it with your arms crossed over your chest. Abdominal crunches Repeat these steps 5-10 times: 1. Lie on your back on a firm bed or the floor with your legs extended. 2. Bend your knees so they are pointing toward the ceiling and your feet are flat on the floor. 3. Cross your arms over your chest. 4. Tip your chin slightly toward your chest without bending your neck. 5. Tighten your abdominal muscles and slowly raise your trunk (torso) high enough to lift your shoulder blades a tiny bit off the floor. Avoid raising your torso higher than that because it can put too much stress on your low back and does not help to strengthen your abdominal muscles. 6. Slowly return to your starting position. Back lifts Repeat these steps 5-10 times: 1. Lie on your abdomen (face-down) with your arms at your sides, and rest your forehead on the floor. 2. Tighten the muscles in your legs and your buttocks. 3. Slowly lift your chest off the floor while you keep your hips pressed to the floor. Keep the back of your head in line with the curve in your back. Your eyes should be looking at the floor. 4. Hold this position for 3-5 seconds. 5. Slowly return to your starting position. Contact a health care provider if:  Your back pain or discomfort gets much worse when you do an exercise.  Your worsening back pain or discomfort does not lessen within 2 hours after you exercise. If you have any of these problems, stop doing these exercises right away. Do not do them again unless your health care provider says that you can. Get help right away if:  You develop sudden, severe back pain. If this happens, stop doing the exercises right away. Do not do them again unless your health care provider says that you can. This information is not intended to replace advice given to you by your health care provider. Make sure you discuss any questions you have with your health care provider. Document Released: 08/11/2004 Document  Revised: 11/08/2018 Document Reviewed: 04/05/2018 Elsevier Patient Education  2020 [employer]  Preventing Type 2 Diabetes Mellitus Type 2 diabetes (type 2 diabetes mellitus) is a long-term (chronic) disease that affects blood sugar (glucose) levels. Normally, a hormone called insulin allows glucose to enter cells in the body. The cells use glucose for energy. In type 2 diabetes, one or both of these problems may be present:  The body does not make enough insulin.  The body does not respond properly to insulin that it makes (insulin resistance). Insulin resistance or lack of insulin causes excess glucose to build up in the blood instead of going into cells. As a result, high blood glucose (hyperglycemia) develops, which can cause many complications. Being overweight or obese and having an inactive (sedentary) lifestyle can increase your risk for diabetes. Type 2 diabetes can be   delayed or prevented by making certain nutrition and lifestyle changes. What nutrition changes can be made?   Eat healthy meals and snacks regularly. Keep a healthy snack with you for when you get hungry between meals, such as fruit or a handful of nuts.  Eat lean meats and proteins that are low in saturated fats, such as chicken, fish, egg whites, and beans. Avoid processed meats.  Eat plenty of fruits and vegetables and plenty of grains that have not been processed (whole grains). It is recommended that you eat: ? 1?2 cups of fruit every day. ? 2?3 cups of vegetables every day. ? 6?8 oz of whole grains every day, such as oats, whole wheat, bulgur, brown rice, quinoa, and millet.  Eat low-fat dairy products, such as milk, yogurt, and cheese.  Eat foods that contain healthy fats, such as nuts, avocado, olive oil, and canola oil.  Drink water throughout the day. Avoid drinks that contain added sugar, such as soda or sweet tea.  Follow instructions from your health care provider about specific eating or drinking  restrictions.  Control how much food you eat at a time (portion size). ? Check food labels to find out the serving sizes of foods. ? Use a kitchen scale to weigh amounts of foods.  Saute or steam food instead of frying it. Cook with water or broth instead of oils or butter.  Limit your intake of: ? Salt (sodium). Have no more than 1 tsp (2,400 mg) of sodium a day. If you have heart disease or high blood pressure, have less than ? tsp (1,500 mg) of sodium a day. ? Saturated fat. This is fat that is solid at room temperature, such as butter or fat on meat. What lifestyle changes can be made? Activity   Do moderate-intensity physical activity for at least 30 minutes on at least 5 days of the week, or as much as told by your health care provider.  Ask your health care provider what activities are safe for you. A mix of physical activities may be best, such as walking, swimming, cycling, and strength training.  Try to add physical activity into your day. For example: ? Park in spots that are farther away than usual, so that you walk more. For example, park in a far corner of the parking lot when you go to the office or the grocery store. ? Take a walk during your lunch break. ? Use stairs instead of elevators or escalators. Weight Loss  Lose weight as directed. Your health care provider can determine how much weight loss is best for you and can help you lose weight safely.  If you are overweight or obese, you may be instructed to lose at least 5?7 % of your body weight. Alcohol and Tobacco   Limit alcohol intake to no more than 1 drink a day for nonpregnant women and 2 drinks a day for men. One drink equals 12 oz of beer, 5 oz of wine, or 1 oz of hard liquor.  Do not use any tobacco products, such as cigarettes, chewing tobacco, and e-cigarettes. If you need help quitting, ask your health care provider. Work With Your Health Care Provider  Have your blood glucose tested regularly,  as told by your health care provider.  Discuss your risk factors and how you can reduce your risk for diabetes.  Get screening tests as told by your health care provider. You may have screening tests regularly, especially if you have certain risk factors for   type 2 diabetes.  Make an appointment with a diet and nutrition specialist (registered dietitian). A registered dietitian can help you make a healthy eating plan and can help you understand portion sizes and food labels. Why are these changes important?  It is possible to prevent or delay type 2 diabetes and related health problems by making lifestyle and nutrition changes.  It can be difficult to recognize signs of type 2 diabetes. The best way to avoid possible damage to your body is to take actions to prevent the disease before you develop symptoms. What can happen if changes are not made?  Your blood glucose levels may keep increasing. Having high blood glucose for a long time is dangerous. Too much glucose in your blood can damage your blood vessels, heart, kidneys, nerves, and eyes.  You may develop prediabetes or type 2 diabetes. Type 2 diabetes can lead to many chronic health problems and complications, such as: ? Heart disease. ? Stroke. ? Blindness. ? Kidney disease. ? Depression. ? Poor circulation in the feet and legs, which could lead to surgical removal (amputation) in severe cases. Where to find support  Ask your health care provider to recommend a registered dietitian, diabetes educator, or weight loss program.  Look for local or online weight loss groups.  Join a gym, fitness club, or outdoor activity group, such as a walking club. Where to find more information To learn more about diabetes and diabetes prevention, visit:  American Diabetes Association (ADA): www.diabetes.org  National Institute of Diabetes and Digestive and Kidney Diseases: www.niddk.nih.gov/health-information/diabetes To learn more about  healthy eating, visit:  The U.[place] Department of Agriculture (USDA), Choose My Plate: www.choosemyplate.gov/food-groups  Office of Disease Prevention and Health Promotion (ODPHP), Dietary Guidelines: www.health.gov/dietaryguidelines Summary  You can reduce your risk for type 2 diabetes by increasing your physical activity, eating healthy foods, and losing weight as directed.  Talk with your health care provider about your risk for type 2 diabetes. Ask about any blood tests or screening tests that you need to have. This information is not intended to replace advice given to you by your health care provider. Make sure you discuss any questions you have with your health care provider. Document Released: 10/26/2015 Document Revised: 10/26/2018 Document Reviewed: 08/25/2015 Elsevier Patient Education  2020 [employer]  Prediabetes Eating Plan Prediabetes is a condition that causes blood sugar (glucose) levels to be higher than normal. This increases the risk for developing diabetes. In order to prevent diabetes from developing, your health care provider may recommend a diet and other lifestyle changes to help you:  Control your blood glucose levels.  Improve your cholesterol levels.  Manage your blood pressure. Your health care provider may recommend working with a diet and nutrition specialist (dietitian) to make a meal plan that is best for you. What are tips for following this plan? Lifestyle  Set weight loss goals with the help of your health care team. It is recommended that most people with prediabetes lose 7% of their current body weight.  Exercise for at least 30 minutes at least 5 days a week.  Attend a support group or seek ongoing support from a mental health counselor.  Take over-the-counter and prescription medicines only as told by your health care provider. Reading food labels  Read food labels to check the amount of fat, salt (sodium), and sugar in prepackaged foods.  Avoid foods that have: ? Saturated fats. ? Trans fats. ? Added sugars.  Avoid foods that have more than   300 milligrams (mg) of sodium per serving. Limit your daily sodium intake to less than 2,300 mg each day. Shopping  Avoid buying pre-made and processed foods. Cooking  Cook with olive oil. Do not use butter, lard, or ghee.  Bake, broil, grill, or boil foods. Avoid frying. Meal planning   Work with your dietitian to develop an eating plan that is right for you. This may include: ? Tracking how many calories you take in. Use a food diary, notebook, or mobile application to track what you eat at each meal. ? Using the glycemic index (GI) to plan your meals. The index tells you how quickly a food will raise your blood glucose. Choose low-GI foods. These foods take a longer time to raise blood glucose.  Consider following a Mediterranean diet. This diet includes: ? Several servings each day of fresh fruits and vegetables. ? Eating fish at least twice a week. ? Several servings each day of whole grains, beans, nuts, and seeds. ? Using olive oil instead of other fats. ? Moderate alcohol consumption. ? Eating small amounts of red meat and whole-fat dairy.  If you have high blood pressure, you may need to limit your sodium intake or follow a diet such as the DASH eating plan. DASH is an eating plan that aims to lower high blood pressure. What foods are recommended? The items listed below may not be a complete list. Talk with your dietitian about what dietary choices are best for you. Grains Whole grains, such as whole-wheat or whole-grain breads, crackers, cereals, and pasta. Unsweetened oatmeal. Bulgur. Barley. Quinoa. Brown rice. Corn or whole-wheat flour tortillas or taco shells. Vegetables Lettuce. Spinach. Peas. Beets. Cauliflower. Cabbage. Broccoli. Carrots. Tomatoes. Squash. Eggplant. Herbs. Peppers. Onions. Cucumbers. Brussels sprouts. Fruits Berries. Bananas. Apples. Oranges.  Grapes. Papaya. Mango. Pomegranate. Kiwi. Grapefruit. Cherries. Meats and other protein foods Seafood. Poultry without skin. Lean cuts of pork and beef. Tofu. Eggs. Nuts. Beans. Dairy Low-fat or fat-free dairy products, such as yogurt, cottage cheese, and cheese. Beverages Water. Tea. Coffee. Sugar-free or diet soda. Seltzer water. Lowfat or no-fat milk. Milk alternatives, such as soy or almond milk. Fats and oils Olive oil. Canola oil. Sunflower oil. Grapeseed oil. Avocado. Walnuts. Sweets and desserts Sugar-free or low-fat pudding. Sugar-free or low-fat ice cream and other frozen treats. Seasoning and other foods Herbs. Sodium-free spices. Mustard. Relish. Low-fat, low-sugar ketchup. Low-fat, low-sugar barbecue sauce. Low-fat or fat-free mayonnaise. What foods are not recommended? The items listed below may not be a complete list. Talk with your dietitian about what dietary choices are best for you. Grains Refined white flour and flour products, such as bread, pasta, snack foods, and cereals. Vegetables Canned vegetables. Frozen vegetables with butter or cream sauce. Fruits Fruits canned with syrup. Meats and other protein foods Fatty cuts of meat. Poultry with skin. Breaded or fried meat. Processed meats. Dairy Full-fat yogurt, cheese, or milk. Beverages Sweetened drinks, such as sweet iced tea and soda. Fats and oils Butter. Lard. Ghee. Sweets and desserts Baked goods, such as cake, cupcakes, pastries, cookies, and cheesecake. Seasoning and other foods Spice mixes with added salt. Ketchup. Barbecue sauce. Mayonnaise. Summary  To prevent diabetes from developing, you may need to make diet and other lifestyle changes to help control blood sugar, improve cholesterol levels, and manage your blood pressure.  Set weight loss goals with the help of your health care team. It is recommended that most people with prediabetes lose 7 percent of their current body weight.  Consider    following a Mediterranean diet that includes plenty of fresh fruits and vegetables, whole grains, beans, nuts, seeds, fish, lean meat, low-fat dairy, and healthy oils. This information is not intended to replace advice given to you by your health care provider. Make sure you discuss any questions you have with your health care provider. Document Released: 11/18/2014 Document Revised: 10/26/2018 Document Reviewed: 09/07/2016 Elsevier Patient Education  2020 [employer]  Healthy Eating Following a healthy eating pattern may help you to achieve and maintain a healthy body weight, reduce the risk of chronic disease, and live a long and productive life. It is important to follow a healthy eating pattern at an appropriate calorie level for your body. Your nutritional needs should be met primarily through food by choosing a variety of nutrient-rich foods. What are tips for following this plan? Reading food labels  Read labels and choose the following: ? Reduced or low sodium. ? Juices with 100% fruit juice. ? Foods with low saturated fats and high polyunsaturated and monounsaturated fats. ? Foods with whole grains, such as whole wheat, cracked wheat, brown rice, and wild rice. ? Whole grains that are fortified with folic acid. This is recommended for women who are pregnant or who want to become pregnant.  Read labels and avoid the following: ? Foods with a lot of added sugars. These include foods that contain brown sugar, corn sweetener, corn syrup, dextrose, fructose, glucose, high-fructose corn syrup, honey, invert sugar, lactose, malt syrup, maltose, molasses, raw sugar, sucrose, trehalose, or turbinado sugar.  Do not eat more than the following amounts of added sugar per day:  6 teaspoons (25 g) for women.  9 teaspoons (38 g) for men. ? Foods that contain processed or refined starches and grains. ? Refined grain products, such as white flour, degermed cornmeal, white bread, and white rice.  Shopping  Choose nutrient-rich snacks, such as vegetables, whole fruits, and nuts. Avoid high-calorie and high-sugar snacks, such as potato chips, fruit snacks, and candy.  Use oil-based dressings and spreads on foods instead of solid fats such as butter, stick margarine, or cream cheese.  Limit pre-made sauces, mixes, and "instant" products such as flavored rice, instant noodles, and ready-made pasta.  Try more plant-protein sources, such as tofu, tempeh, black beans, edamame, lentils, nuts, and seeds.  Explore eating plans such as the Mediterranean diet or vegetarian diet. Cooking  Use oil to saut or stir-fry foods instead of solid fats such as butter, stick margarine, or lard.  Try baking, boiling, grilling, or broiling instead of frying.  Remove the fatty part of meats before cooking.  Steam vegetables in water or broth. Meal planning   At meals, imagine dividing your plate into fourths: ? One-half of your plate is fruits and vegetables. ? One-fourth of your plate is whole grains. ? One-fourth of your plate is protein, especially lean meats, poultry, eggs, tofu, beans, or nuts.  Include low-fat dairy as part of your daily diet. Lifestyle  Choose healthy options in all settings, including home, work, school, restaurants, or stores.  Prepare your food safely: ? Wash your hands after handling raw meats. ? Keep food preparation surfaces clean by regularly washing with hot, soapy water. ? Keep raw meats separate from ready-to-eat foods, such as fruits and vegetables. ? Cook seafood, meat, poultry, and eggs to the recommended internal temperature. ? Store foods at safe temperatures. In general:  Keep cold foods at 40F (4.4C) or below.  Keep hot foods at 140F (60C) or above.  Keep   your freezer at 0F (-17.8C) or below.  Foods are no longer safe to eat when they have been between the temperatures of 40-140F (4.4-60C) for more than 2 hours. What foods should I eat?  Fruits Aim to eat 2 cup-equivalents of fresh, canned (in natural juice), or frozen fruits each day. Examples of 1 cup-equivalent of fruit include 1 small apple, 8 large strawberries, 1 cup canned fruit,  cup dried fruit, or 1 cup 100% juice. Vegetables Aim to eat 2-3 cup-equivalents of fresh and frozen vegetables each day, including different varieties and colors. Examples of 1 cup-equivalent of vegetables include 2 medium carrots, 2 cups raw, leafy greens, 1 cup chopped vegetable (raw or cooked), or 1 medium baked potato. Grains Aim to eat 6 ounce-equivalents of whole grains each day. Examples of 1 ounce-equivalent of grains include 1 slice of bread, 1 cup ready-to-eat cereal, 3 cups popcorn, or  cup cooked rice, pasta, or cereal. Meats and other proteins Aim to eat 5-6 ounce-equivalents of protein each day. Examples of 1 ounce-equivalent of protein include 1 egg, 1/2 cup nuts or seeds, or 1 tablespoon (16 g) peanut butter. A cut of meat or fish that is the size of a deck of cards is about 3-4 ounce-equivalents.  Of the protein you eat each week, try to have at least 8 ounces come from seafood. This includes salmon, trout, herring, and anchovies. Dairy Aim to eat 3 cup-equivalents of fat-free or low-fat dairy each day. Examples of 1 cup-equivalent of dairy include 1 cup (240 mL) milk, 8 ounces (250 g) yogurt, 1 ounces (44 g) natural cheese, or 1 cup (240 mL) fortified soy milk. Fats and oils  Aim for about 5 teaspoons (21 g) per day. Choose monounsaturated fats, such as canola and olive oils, avocados, peanut butter, and most nuts, or polyunsaturated fats, such as sunflower, corn, and soybean oils, walnuts, pine nuts, sesame seeds, sunflower seeds, and flaxseed. Beverages  Aim for six 8-oz glasses of water per day. Limit coffee to three to five 8-oz cups per day.  Limit caffeinated beverages that have added calories, such as soda and energy drinks.  Limit alcohol intake to no more than 1  drink a day for nonpregnant women and 2 drinks a day for men. One drink equals 12 oz of beer (355 mL), 5 oz of wine (148 mL), or 1 oz of hard liquor (44 mL). Seasoning and other foods  Avoid adding excess amounts of salt to your foods. Try flavoring foods with herbs and spices instead of salt.  Avoid adding sugar to foods.  Try using oil-based dressings, sauces, and spreads instead of solid fats. This information is based on general [place] nutrition guidelines. For more information, visit choosemyplate.gov. Exact amounts may vary based on your nutrition needs. Summary  A healthy eating plan may help you to maintain a healthy weight, reduce the risk of chronic diseases, and stay active throughout your life.  Plan your meals. Make sure you eat the right portions of a variety of nutrient-rich foods.  Try baking, boiling, grilling, or broiling instead of frying.  Choose healthy options in all settings, including home, work, school, restaurants, or stores. This information is not intended to replace advice given to you by your health care provider. Make sure you discuss any questions you have with your health care provider. Document Released: 10/16/2017 Document Revised: 10/16/2017 Document Reviewed: 10/16/2017 Elsevier Patient Education  2020 Elsevier Inc.   Zoster Vaccine, Recombinant injection What is this medicine? ZOSTER VACCINE (  ZOS ter vak SEEN) is used to prevent shingles in adults 51 years old and over. This vaccine is not used to treat shingles or nerve pain from shingles. This medicine may be used for other purposes; ask your health care provider or pharmacist if you have questions. COMMON BRAND NAME(S): [facility] What should I tell my health care provider before I take this medicine? They need to know if you have any of these conditions:  blood disorders or disease  cancer like leukemia or lymphoma  immune system problems or therapy  an unusual or allergic reaction to  vaccines, other medications, foods, dyes, or preservatives  pregnant or trying to get pregnant  breast-feeding How should I use this medicine? This vaccine is for injection in a muscle. It is given by a health care professional. Talk to your pediatrician regarding the use of this medicine in children. This medicine is not approved for use in children. Overdosage: If you think you have taken too much of this medicine contact a poison control center or emergency room at once. NOTE: This medicine is only for you. Do not share this medicine with others. What if I miss a dose? Keep appointments for follow-up (booster) doses as directed. It is important not to miss your dose. Call your doctor or health care professional if you are unable to keep an appointment. What may interact with this medicine?  medicines that suppress your immune system  medicines to treat cancer  steroid medicines like prednisone or cortisone This list may not describe all possible interactions. Give your health care provider a list of all the medicines, herbs, non-prescription drugs, or dietary supplements you use. Also tell them if you smoke, drink alcohol, or use illegal drugs. Some items may interact with your medicine. What should I watch for while using this medicine? Visit your doctor for regular check ups. This vaccine, like all vaccines, may not fully protect everyone. What side effects may I notice from receiving this medicine? Side effects that you should report to your doctor or health care professional as soon as possible:  allergic reactions like skin rash, itching or hives, swelling of the face, lips, or tongue  breathing problems Side effects that usually do not require medical attention (report these to your doctor or health care professional if they continue or are bothersome):  chills  headache  fever  nausea, vomiting  redness, warmth, pain, swelling or itching at site where injected   tiredness This list may not describe all possible side effects. Call your doctor for medical advice about side effects. You may report side effects to FDA at 1-800-FDA-1088. Where should I keep my medicine? This vaccine is only given in a clinic, pharmacy, doctor's office, or other health care setting and will not be stored at home. NOTE: This sheet is a summary. It may not cover all possible information. If you have questions about this medicine, talk to your doctor, pharmacist, or health care provider.  2020 Elsevier/Gold Standard (2017-02-13 13:20:30)

## 2019-06-12 NOTE — Progress Notes (Signed)
Virtual Visit via Video Note  I connected with Nichole Rodriguez   on 06/12/19 at  9:20 AM EST by a video enabled telemedicine application and verified that I am speaking with the correct person using two identifiers.  Location patient: home Location provider:work or home office Persons participating in the virtual visit: patient, provider  I discussed the limitations of evaluation and management by telemedicine and the availability of in person appointments. The patient expressed understanding and agreed to proceed.   HPI: 1. 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 Irregular menses no cycle 09/2018 until 04/2019 likely perimenopausal  3. Obesity and prediabetes trying to lose wt   ROS: See pertinent positives and negatives per HPI.  Past Medical History:  Diagnosis Date  . Fibroids   . Genital herpes   . Genital warts   . Hyperlipidemia   . Hypertension   . Migraine     Past Surgical History:  Procedure Laterality Date  . COLONOSCOPY WITH PROPOFOL N/A 08/28/2018   Procedure: COLONOSCOPY WITH PROPOFOL;  Surgeon: Lin Landsman, MD;  Location: [facility] ENDOSCOPY;  Service: Gastroenterology;  Laterality: N/A;  . ECTOPIC PREGNANCY SURGERY Left   . EMBOLIZATION     For fibroid  . ESOPHAGOGASTRODUODENOSCOPY (EGD) WITH PROPOFOL N/A 08/28/2018   Procedure: ESOPHAGOGASTRODUODENOSCOPY (EGD) WITH PROPOFOL;  Surgeon: Lin Landsman, MD;  Location: [place];  Service: Gastroenterology;  Laterality: N/A;    Family History  Problem Relation Age of Onset  . Arthritis Mother   . Diabetes Mother   . Diabetes Father   . Cancer Brother   . Cancer Maternal Grandfather   . Breast cancer Maternal Aunt   . Ovarian cancer Maternal Aunt   . Colon cancer Neg Hx     SOCIAL HX:  1 daughter  Research officer, political party in [employer]  Current Outpatient Medications:  .  amitriptyline (ELAVIL) 10 MG tablet, Take 1 tablet (10 mg total) by mouth at bedtime., Disp: 90 tablet, Rfl: 3 .  fluticasone (FLONASE) 50 MCG/ACT nasal spray, Place 1-2 sprays into both nostrils daily., Disp: 16 g, Rfl: 12 .  furosemide (LASIX) 20 MG tablet, Take 1 tablet (20 mg total) by mouth daily as needed., Disp: 90 tablet, Rfl: 0 .  hydrocortisone 2.5 % ointment, Apply topically 2 (two) times daily. Prn ears, Disp: 60 g, Rfl: 11 .  lisinopril-hydrochlorothiazide (ZESTORETIC) 10-12.5 MG tablet, Take 1 tablet by mouth daily., Disp: 90 tablet, Rfl: 3 .  loratadine (CLARITIN) 10 MG tablet, Take 1 tablet (10 mg total) by mouth daily as needed for allergies., Disp: 90 tablet, Rfl: 3 .  sodium chloride (OCEAN) 0.65 % SOLN nasal spray, Place 2 sprays into both nostrils as needed for congestion., Disp: 1 Bottle, Rfl: 12 .  valACYclovir (VALTREX) 500 MG tablet, Take 1 tablet (500 mg total) by mouth daily. For prevention bid x 3-7 days outbreak, Disp: 90 tablet, Rfl: 3 .  methocarbamol (ROBAXIN) 500 MG tablet, Take 1 tablet (500 mg total) by mouth at bedtime as needed for muscle spasms., Disp: 90 tablet, Rfl: 0 .  omeprazole (PRILOSEC) 40 MG capsule, Take 1 capsule (40 mg total) by mouth 2 (two) times daily before a meal., Disp: 180 capsule, Rfl: 1 .  omeprazole (PRILOSEC) 40 MG capsule, Take 1 capsule (40 mg total)  by mouth 2 (two) times daily for 14 days., Disp: 28 capsule, Rfl: 0  EXAM:  VITALS per patient if applicable:  GENERAL: alert, oriented, appears well and in no acute distress  HEENT: atraumatic, conjunttiva clear, no obvious abnormalities on inspection of external nose and ears  NECK: normal movements of the head and neck  LUNGS: on inspection no signs of respiratory distress, breathing rate appears normal, no obvious gross SOB, gasping or wheezing  CV: no obvious cyanosis  MS: moves all visible extremities without noticeable  abnormality  PSYCH/NEURO: pleasant and cooperative, no obvious depression or anxiety, speech and thought processing grossly intact  ASSESSMENT AND PLAN:  Discussed the following assessment and plan:  Low back pain without sciatica, unspecified back pain laterality, unspecified chronicity - Plan: methocarbamol (ROBAXIN) 500 MG tablet qhs prn  Disc robaxin qhs prn  Prn Tylenol  Lidocaine patch otch  Back stretches  Call back if wants to see PT or back specliast   Insomnia, unspecified type - Plan: amitriptyline (ELAVIL) 10 MG tablet  Prediabetes Fasting lasb sch 06/17/19   Obesity (BMI 30-39.9) -rec healthy diet and exercise info given today    HM Had flu shot10//8/19sch 06/17/19 with fasting labs  Tdap had Hep B3/3check status rec shingrix as well   Pap neg 07/05/17 neg hpvDr. Defrancesco mammo neg 7/15/19new order inpt to call to schedule  Colonoscopy2/11/20 normal  sch fasting labs11/30/20  -we discussed possible serious and likely etiologies, options for evaluation and workup, limitations of telemedicine visit vs in person visit, treatment, treatment risks and precautions. Pt prefers to treat via telemedicine empirically rather then risking or undertaking an in person visit at this moment. Patient agrees to seek prompt in person care if worsening, new symptoms arise, or if is not improving with treatment.   I discussed the assessment and treatment plan with the patient. The patient was provided an opportunity to ask questions and all were answered. The patient agreed with the plan and demonstrated an understanding of the instructions.   The patient was advised to call back or seek an in-person evaluation if the symptoms worsen or if the condition fails to improve as anticipated.  Time spent 15 minute s Delorise Jackson, MD

## 2019-06-17 ENCOUNTER — Encounter: Payer: Self-pay | Admitting: Internal Medicine

## 2019-06-17 ENCOUNTER — Other Ambulatory Visit (INDEPENDENT_AMBULATORY_CARE_PROVIDER_SITE_OTHER): Payer: BC Managed Care – PPO

## 2019-06-17 ENCOUNTER — Other Ambulatory Visit: Payer: Self-pay

## 2019-06-17 ENCOUNTER — Other Ambulatory Visit: Payer: Self-pay | Admitting: Internal Medicine

## 2019-06-17 DIAGNOSIS — R7303 Prediabetes: Secondary | ICD-10-CM | POA: Diagnosis not present

## 2019-06-17 DIAGNOSIS — Z1389 Encounter for screening for other disorder: Secondary | ICD-10-CM | POA: Diagnosis not present

## 2019-06-17 DIAGNOSIS — Z23 Encounter for immunization: Secondary | ICD-10-CM | POA: Diagnosis not present

## 2019-06-17 DIAGNOSIS — I10 Essential (primary) hypertension: Secondary | ICD-10-CM

## 2019-06-17 DIAGNOSIS — Z1159 Encounter for screening for other viral diseases: Secondary | ICD-10-CM

## 2019-06-17 DIAGNOSIS — E559 Vitamin D deficiency, unspecified: Secondary | ICD-10-CM

## 2019-06-17 DIAGNOSIS — Z0184 Encounter for antibody response examination: Secondary | ICD-10-CM | POA: Diagnosis not present

## 2019-06-17 DIAGNOSIS — Z13220 Encounter for screening for lipoid disorders: Secondary | ICD-10-CM

## 2019-06-17 DIAGNOSIS — Z1329 Encounter for screening for other suspected endocrine disorder: Secondary | ICD-10-CM

## 2019-06-17 HISTORY — DX: Vitamin D deficiency, unspecified: E55.9

## 2019-06-17 LAB — LIPID PANEL
Cholesterol: 233 mg/dL — ABNORMAL HIGH (ref 0–200)
HDL: 61 mg/dL (ref >39.00)
LDL Cholesterol: 155 mg/dL — ABNORMAL HIGH (ref 0–99)
NonHDL: 172.09
Total CHOL/HDL Ratio: 4
Triglycerides: 84 mg/dL (ref 0.0–149.0)
VLDL: 16.8 mg/dL (ref 0.0–40.0)

## 2019-06-17 LAB — COMPREHENSIVE METABOLIC PANEL
ALT: 25 U/L (ref 0–35)
AST: 24 U/L (ref 0–37)
Albumin: 4 g/dL (ref 3.5–5.2)
Alkaline Phosphatase: 48 U/L (ref 39–117)
BUN: 11 mg/dL (ref 6–23)
CO2: 28 mEq/L (ref 19–32)
Calcium: 9.3 mg/dL (ref 8.4–10.5)
Chloride: 101 mEq/L (ref 96–112)
Creatinine, Ser: 0.93 mg/dL (ref 0.40–1.20)
GFR: 76.9 mL/min (ref >60.00)
Glucose, Bld: 102 mg/dL — ABNORMAL HIGH (ref 70–99)
Potassium: 3.5 mEq/L (ref 3.5–5.1)
Sodium: 138 mEq/L (ref 135–145)
Total Bilirubin: 0.5 mg/dL (ref 0.2–1.2)
Total Protein: 6.8 g/dL (ref 6.0–8.3)

## 2019-06-17 LAB — CBC WITH DIFFERENTIAL/PLATELET
Basophils Absolute: 0 10*3/uL (ref 0.0–0.1)
Basophils Relative: 0.4 % (ref 0.0–3.0)
Eosinophils Absolute: 0.1 10*3/uL (ref 0.0–0.7)
Eosinophils Relative: 1.5 % (ref 0.0–5.0)
HCT: 41.6 % (ref 36.0–46.0)
Hemoglobin: 13.6 g/dL (ref 12.0–15.0)
Lymphocytes Relative: 46.4 % — ABNORMAL HIGH (ref 12.0–46.0)
Lymphs Abs: 2.4 10*3/uL (ref 0.7–4.0)
MCHC: 32.6 g/dL (ref 30.0–36.0)
MCV: 84.7 fl (ref 78.0–100.0)
Monocytes Absolute: 0.4 10*3/uL (ref 0.1–1.0)
Monocytes Relative: 8 % (ref 3.0–12.0)
Neutro Abs: 2.3 10*3/uL (ref 1.4–7.7)
Neutrophils Relative %: 43.7 % (ref 43.0–77.0)
Platelets: 167 10*3/uL (ref 150.0–400.0)
RBC: 4.91 Mil/uL (ref 3.87–5.11)
RDW: 13.3 % (ref 11.5–15.5)
WBC: 5.2 10*3/uL (ref 4.0–10.5)

## 2019-06-17 LAB — TSH: TSH: 1.68 u[IU]/mL (ref 0.35–4.50)

## 2019-06-17 LAB — HEMOGLOBIN A1C: Hgb A1c MFr Bld: 6 % (ref 4.6–6.5)

## 2019-06-17 LAB — VITAMIN D 25 HYDROXY (VIT D DEFICIENCY, FRACTURES): VITD: 16.61 ng/mL — ABNORMAL LOW (ref 30.00–100.00)

## 2019-06-17 MED ORDER — CHOLECALCIFEROL 1.25 MG (50000 UT) PO CAPS: 50000.0000 [IU] | ORAL_CAPSULE | ORAL | 1 refills | Status: DC

## 2019-06-18 ENCOUNTER — Encounter: Payer: Self-pay | Admitting: Internal Medicine

## 2019-06-18 LAB — URINALYSIS, ROUTINE W REFLEX MICROSCOPIC
Bacteria, UA: NONE SEEN /HPF
Bilirubin Urine: NEGATIVE
Glucose, UA: NEGATIVE
Hgb urine dipstick: NEGATIVE
Hyaline Cast: NONE SEEN /LPF
Ketones, ur: NEGATIVE
Leukocytes,Ua: NEGATIVE
Nitrite: NEGATIVE
Specific Gravity, Urine: 1.03 (ref 1.001–1.03)
pH: 6 (ref 5.0–8.0)

## 2019-06-18 LAB — HEPATITIS B SURFACE ANTIBODY, QUANTITATIVE: Hep B S AB Quant (Post): 789 m[IU]/mL (ref >=10)

## 2019-06-18 NOTE — Telephone Encounter (Signed)
Called and spoke to pt.  Pt was upset and crying on phone during conversation.  Pt said that she is overwhelmed from the demands of her job and her father. Pt said that she feels as if she is going to have a nervous breakdown.  Pt is requesting to be taken out of work for a couple of days to regroup.  Pt said that a majority of her stress is coming from her job and said that she has plans of quitting at the end of the year. 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    Pt does not want to be on medication but is agreeable to a referral to a therapist but prefers a virtual appt.  Patient was given GAD-7 Anxiety Screening and PHQ-9 screening.  GAD-7 Score:  16 (Answer:  Very difficult to do work, take care of things at home, or get along with other people)  PHQ-9 Score:  11  (Answer:  Extremely difficult to work, take care of things at home, or get along with other people)  Pt is requesting a work note to be out for the rest of the week.  Pt requesting work note be faxed to employer and would also like to pick up a copy of letter from front desk of office.  Please fax to:  [facility] DSS/CPS                         Attention:  Lilia Argue  Fax:  [phone]  Please contact pt when letter has been faxed and ready for pick up.

## 2019-06-19 ENCOUNTER — Telehealth: Payer: Self-pay | Admitting: *Deleted

## 2019-06-19 ENCOUNTER — Ambulatory Visit: Payer: Self-pay

## 2019-06-19 NOTE — Telephone Encounter (Signed)
Please see result note 

## 2019-06-19 NOTE — Telephone Encounter (Signed)
Copied from [place] [id number]. Topic: General - Inquiry >> Jun 19, 2019  8:51 AM Virl Axe D wrote: Reason for CRM: Pt would like a callback today from Dr. Claris Gladden assistant. She has some questions regarding lab results and letter for work.

## 2019-06-21 ENCOUNTER — Telehealth: Payer: Self-pay | Admitting: Internal Medicine

## 2019-06-21 NOTE — Telephone Encounter (Signed)
Pt dropped off papers that need to be completed for her employment. Papers are up front in color folder.

## 2019-06-26 ENCOUNTER — Other Ambulatory Visit: Payer: Self-pay | Admitting: Internal Medicine

## 2019-06-26 ENCOUNTER — Encounter: Payer: Self-pay | Admitting: Internal Medicine

## 2019-06-26 DIAGNOSIS — F329 Major depressive disorder, single episode, unspecified: Secondary | ICD-10-CM

## 2019-06-26 DIAGNOSIS — F4323 Adjustment disorder with mixed anxiety and depressed mood: Secondary | ICD-10-CM

## 2019-06-26 DIAGNOSIS — F439 Reaction to severe stress, unspecified: Secondary | ICD-10-CM

## 2019-06-26 DIAGNOSIS — F419 Anxiety disorder, unspecified: Secondary | ICD-10-CM

## 2019-06-26 DIAGNOSIS — E785 Hyperlipidemia, unspecified: Secondary | ICD-10-CM

## 2019-06-26 DIAGNOSIS — F32A Depression, unspecified: Secondary | ICD-10-CM

## 2019-06-26 MED ORDER — ATORVASTATIN CALCIUM 10 MG PO TABS: 10.0000 mg | ORAL_TABLET | Freq: Every day | ORAL | 3 refills | Status: DC

## 2019-06-28 DIAGNOSIS — Z0279 Encounter for issue of other medical certificate: Secondary | ICD-10-CM

## 2019-07-02 ENCOUNTER — Telehealth: Payer: Self-pay | Admitting: Internal Medicine

## 2019-07-02 ENCOUNTER — Other Ambulatory Visit: Payer: Self-pay | Admitting: Internal Medicine

## 2019-07-02 DIAGNOSIS — I10 Essential (primary) hypertension: Secondary | ICD-10-CM

## 2019-07-02 DIAGNOSIS — Z111 Encounter for screening for respiratory tuberculosis: Secondary | ICD-10-CM

## 2019-07-02 NOTE — Telephone Encounter (Signed)
Patient has nurse visit tomorrow at [employer]

## 2019-07-02 NOTE — Telephone Encounter (Signed)
Call pt  1. Paperwork ready make copies and scan  2. Pay fee 3. She needs quantiferon gold today and EKG today paperwork due today  4. She needs vision test today and vision sheets with sticky notes filled out all there are multiple  5. Attach copy of labs 06/17/2019  6. Given copy of EKG with the forms and scan other copy   [name]

## 2019-07-03 ENCOUNTER — Encounter: Payer: Self-pay | Admitting: *Deleted

## 2019-07-03 ENCOUNTER — Other Ambulatory Visit: Payer: Self-pay

## 2019-07-03 ENCOUNTER — Ambulatory Visit (INDEPENDENT_AMBULATORY_CARE_PROVIDER_SITE_OTHER): Payer: BC Managed Care – PPO | Admitting: *Deleted

## 2019-07-03 DIAGNOSIS — Z0100 Encounter for examination of eyes and vision without abnormal findings: Secondary | ICD-10-CM

## 2019-07-03 DIAGNOSIS — I10 Essential (primary) hypertension: Secondary | ICD-10-CM

## 2019-07-03 NOTE — Progress Notes (Signed)
Patient in for eye exam and ECG for emploment application for [employer] de[employer]. Patient vision for distance no glasses 20/20 bilateral . With glasses for reading only 20/20. Patient passed color test able to distinguish color. ECG performed on portable viewed by PCP and sent to scan to chart. Patient given copy of ECG and paperwork given to patient for employment after completion.

## 2019-07-08 NOTE — Telephone Encounter (Signed)
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

## 2019-08-06 ENCOUNTER — Ambulatory Visit: Payer: BC Managed Care – PPO | Admitting: Psychology

## 2019-10-23 ENCOUNTER — Ambulatory Visit: Payer: BC Managed Care – PPO | Admitting: Internal Medicine

## 2019-10-23 ENCOUNTER — Telehealth: Payer: Self-pay | Admitting: Internal Medicine

## 2019-10-23 NOTE — Telephone Encounter (Signed)
Pt no showed for her 9:00 am appointment with dr Olivia Mackie.   Please reschedule this patient. Her appointment has been canceled.

## 2020-03-25 ENCOUNTER — Telehealth: Payer: Self-pay | Admitting: Internal Medicine

## 2020-03-25 NOTE — Telephone Encounter (Signed)
Letter sent for patient to call and schedule a follow up top continue to have medications filled.

## 2020-03-30 ENCOUNTER — Other Ambulatory Visit: Payer: Self-pay | Admitting: Internal Medicine

## 2020-03-30 DIAGNOSIS — I10 Essential (primary) hypertension: Secondary | ICD-10-CM

## 2020-03-30 MED ORDER — LISINOPRIL-HYDROCHLOROTHIAZIDE 10-12.5 MG PO TABS: 1.0000 | ORAL_TABLET | Freq: Every day | ORAL | 1 refills | Status: DC

## 2020-03-30 NOTE — Telephone Encounter (Signed)
Please advise 

## 2020-03-30 NOTE — Telephone Encounter (Signed)
What has her BP been running?  This is ok will do but she will need to get affordable care insurance in future with open enrollment upcoming she needs research this and sign up

## 2020-03-30 NOTE — Telephone Encounter (Signed)
Pt called in and said she quit her job and don't have insurance. She scheduled appointment on 04/10/20 with Dr. Olivia Mackie virtual visit. She is asking if we call her in enough lisinopril until her appointment?

## 2020-03-31 NOTE — Telephone Encounter (Signed)
Patient informed and verbalized understanding.  States she does not have a cuff at home. She says she was feeling fine until she ran out of medication. States she then started having headaches.  Informed patient of getting a cuff from a pharmacy or ordering off [place]. She states she will look into this.

## 2020-04-09 ENCOUNTER — Other Ambulatory Visit: Payer: Self-pay | Admitting: Internal Medicine

## 2020-04-09 DIAGNOSIS — I10 Essential (primary) hypertension: Secondary | ICD-10-CM

## 2020-04-09 MED ORDER — LISINOPRIL-HYDROCHLOROTHIAZIDE 10-12.5 MG PO TABS: 1.0000 | ORAL_TABLET | Freq: Every day | ORAL | 1 refills | Status: DC

## 2020-04-10 ENCOUNTER — Telehealth (INDEPENDENT_AMBULATORY_CARE_PROVIDER_SITE_OTHER): Payer: BC Managed Care – PPO | Admitting: Internal Medicine

## 2020-04-10 ENCOUNTER — Encounter: Payer: Self-pay | Admitting: Internal Medicine

## 2020-04-10 ENCOUNTER — Other Ambulatory Visit: Payer: Self-pay

## 2020-04-10 VITALS — Ht 68.0 in | Wt 227.0 lb

## 2020-04-10 DIAGNOSIS — R7303 Prediabetes: Secondary | ICD-10-CM

## 2020-04-10 DIAGNOSIS — M545 Low back pain, unspecified: Secondary | ICD-10-CM

## 2020-04-10 DIAGNOSIS — J309 Allergic rhinitis, unspecified: Secondary | ICD-10-CM

## 2020-04-10 DIAGNOSIS — G4700 Insomnia, unspecified: Secondary | ICD-10-CM

## 2020-04-10 DIAGNOSIS — Z1329 Encounter for screening for other suspected endocrine disorder: Secondary | ICD-10-CM

## 2020-04-10 DIAGNOSIS — E611 Iron deficiency: Secondary | ICD-10-CM

## 2020-04-10 DIAGNOSIS — Z1389 Encounter for screening for other disorder: Secondary | ICD-10-CM

## 2020-04-10 DIAGNOSIS — J329 Chronic sinusitis, unspecified: Secondary | ICD-10-CM

## 2020-04-10 DIAGNOSIS — B009 Herpesviral infection, unspecified: Secondary | ICD-10-CM

## 2020-04-10 DIAGNOSIS — M25571 Pain in right ankle and joints of right foot: Secondary | ICD-10-CM

## 2020-04-10 DIAGNOSIS — L309 Dermatitis, unspecified: Secondary | ICD-10-CM

## 2020-04-10 DIAGNOSIS — I10 Essential (primary) hypertension: Secondary | ICD-10-CM

## 2020-04-10 DIAGNOSIS — E559 Vitamin D deficiency, unspecified: Secondary | ICD-10-CM

## 2020-04-10 DIAGNOSIS — Z1231 Encounter for screening mammogram for malignant neoplasm of breast: Secondary | ICD-10-CM

## 2020-04-10 DIAGNOSIS — E785 Hyperlipidemia, unspecified: Secondary | ICD-10-CM

## 2020-04-10 DIAGNOSIS — E669 Obesity, unspecified: Secondary | ICD-10-CM

## 2020-04-10 DIAGNOSIS — G8929 Other chronic pain: Secondary | ICD-10-CM

## 2020-04-10 DIAGNOSIS — R519 Headache, unspecified: Secondary | ICD-10-CM

## 2020-04-10 MED ORDER — FLUTICASONE PROPIONATE 50 MCG/ACT NA SUSP: 1.0000 | Freq: Every day | NASAL | 12 refills | Status: DC

## 2020-04-10 MED ORDER — CHOLECALCIFEROL 1.25 MG (50000 UT) PO CAPS: 50000.0000 [IU] | ORAL_CAPSULE | ORAL | 1 refills | Status: DC

## 2020-04-10 MED ORDER — VALACYCLOVIR HCL 500 MG PO TABS: 500.0000 mg | ORAL_TABLET | Freq: Every day | ORAL | 3 refills | Status: DC

## 2020-04-10 MED ORDER — ATORVASTATIN CALCIUM 10 MG PO TABS: 10.0000 mg | ORAL_TABLET | Freq: Every day | ORAL | 3 refills | Status: DC

## 2020-04-10 MED ORDER — METHOCARBAMOL 500 MG PO TABS: 500.0000 mg | ORAL_TABLET | Freq: Every evening | ORAL | 0 refills | Status: DC | PRN

## 2020-04-10 MED ORDER — SALINE SPRAY 0.65 % NA SOLN: 2.0000 | NASAL | 12 refills | Status: DC | PRN

## 2020-04-10 MED ORDER — HYDROCORTISONE 2.5 % EX OINT: TOPICAL_OINTMENT | Freq: Two times a day (BID) | CUTANEOUS | 11 refills | Status: DC

## 2020-04-10 MED ORDER — AMITRIPTYLINE HCL 10 MG PO TABS: 10.0000 mg | ORAL_TABLET | Freq: Every day | ORAL | 3 refills | Status: DC

## 2020-04-10 MED ORDER — LORATADINE 10 MG PO TABS: 10.0000 mg | ORAL_TABLET | Freq: Every day | ORAL | 3 refills | Status: DC | PRN

## 2020-04-10 NOTE — Progress Notes (Signed)
Virtual Visit via Video Note  I connected with Glyn Ade  on 04/10/20 at  8:30 AM EDT by a video enabled telemedicine application and verified that I am speaking with the correct person using two identifiers.  Location patient: home Location provider:work or home office Persons participating in the virtual visit: patient, provider  I discussed the limitations of evaluation and management by telemedicine and the availability of in person appointments. The patient expressed understanding and agreed to proceed.   HPI: 1. BP on  Lis hctz 10-12.5 mg qd not checking BP cousin checked BP and normal  2. C/o head frontal and left temporal pressure x weeks not seen eye MD in 2 years mom had brain tumor and m gm anerusym currently no insurance but will want MRI in future she had pressure like mild h/a x 3-4 days when w/o BP medication which was throbbing but now resolved  She has had stressors with her dad but tries to exercise in her downtime  3. right ankle pain/knot and hurts x a while or few months pain is mild and nothing tried  Disc consider Xray  No h/o trauma  4. 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  Past Medical History:  Diagnosis Date  . Fibroids   . Genital herpes   . Genital warts   . Hyperlipidemia   . Hypertension   . Migraine     Past Surgical History:  Procedure Laterality Date  . COLONOSCOPY WITH PROPOFOL N/A 08/28/2018   Procedure: COLONOSCOPY WITH PROPOFOL;  Surgeon: Lin Landsman, MD;  Location: [facility] ENDOSCOPY;  Service: Gastroenterology;  Laterality: N/A;  . ECTOPIC PREGNANCY SURGERY Left   . EMBOLIZATION     For fibroid  . ESOPHAGOGASTRODUODENOSCOPY (EGD) WITH PROPOFOL N/A 08/28/2018   Procedure: ESOPHAGOGASTRODUODENOSCOPY (EGD) WITH  PROPOFOL;  Surgeon: Lin Landsman, MD;  Location: [place];  Service: Gastroenterology;  Laterality: N/A;     Current Outpatient Medications:  .  amitriptyline (ELAVIL) 10 MG tablet, Take 1 tablet (10 mg total) by mouth at bedtime., Disp: 90 tablet, Rfl: 3 .  Cholecalciferol 1.25 MG (50000 UT) capsule, Take 1 capsule (50,000 Units total) by mouth once a week., Disp: 13 capsule, Rfl: 1 .  fluticasone (FLONASE) 50 MCG/ACT nasal spray, Place 1-2 sprays into both nostrils daily., Disp: 16 g, Rfl: 12 .  hydrocortisone 2.5 % ointment, Apply topically 2 (two) times daily. Prn ears, Disp: 60 g, Rfl: 11 .  lisinopril-hydrochlorothiazide (ZESTORETIC) 10-12.5 MG tablet, Take 1 tablet by mouth daily. In am, Disp: 90 tablet, Rfl: 1 .  loratadine (CLARITIN) 10 MG tablet, Take 1 tablet (10 mg total) by mouth daily as needed for allergies., Disp: 90 tablet, Rfl: 3 .  methocarbamol (ROBAXIN) 500 MG tablet, Take 1 tablet (500 mg total) by mouth at bedtime as needed for muscle spasms., Disp: 90 tablet, Rfl: 0 .  sodium chloride (OCEAN) 0.65 % SOLN nasal spray, Place 2 sprays into both nostrils as needed for congestion., Disp: 30 mL, Rfl: 12 .  valACYclovir (VALTREX) 500 MG tablet, Take 1 tablet (500 mg total) by mouth daily. For prevention bid x 3-7 days outbreak, Disp: 90 tablet, Rfl: 3 .  atorvastatin (LIPITOR) 10 MG tablet, Take 1 tablet (10 mg total) by mouth  daily at 6 PM., Disp: 90 tablet, Rfl: 3  EXAM:  VITALS per patient if applicable:  GENERAL: alert, oriented, appears well and in no acute distress  HEENT: atraumatic, conjunttiva clear, no obvious abnormalities on inspection of external nose and ears  NECK: normal movements of the head and neck  LUNGS: on inspection no signs of respiratory distress, breathing rate appears normal, no obvious gross SOB, gasping or wheezing  CV: no obvious cyanosis  MS: moves all visible extremities without noticeable abnormality  PSYCH/NEURO: pleasant  and cooperative, no obvious depression or anxiety, speech and thought processing grossly intact  ASSESSMENT AND PLAN:  Discussed the following assessment and plan:  Essential hypertension Monitor cont meds per pt controlled   HSV infection - Plan: valACYclovir (VALTREX) 500 MG tablet  Sinusitis, unspecified chronicity, unspecified location - Plan: sodium chloride (OCEAN) 0.65 % SOLN nasal spray  Low back pain without sciatica, unspecified back pain laterality, unspecified chronicity - Plan: methocarbamol (ROBAXIN) 500 MG tablet  Allergic rhinitis, unspecified seasonality, unspecified trigger - Plan: loratadine (CLARITIN) 10 MG tablet, fluticasone (FLONASE) 50 MCG/ACT nasal spray  Dermatitis - Plan: hydrocortisone 2.5 % ointment  Vitamin D deficiency - Plan: Cholecalciferol 1.25 MG (50000 UT) capsule  Hyperlipidemia, unspecified hyperlipidemia type - Plan: atorvastatin (LIPITOR) 10 MG tablet  Insomnia, unspecified type - Plan: amitriptyline (ELAVIL) 10 MG tablet  Obesity (BMI 30-39.9) Will refer to wt loss clinic when pt has insurance   Right ankle pain and mass consider Xray in future  H/a  Consider MRI imaging of brain in future check esr w/u TA  HM-pap at f/u Had flu shotneeded in future Tdap had Hep B3/3immune Pfizer 2/2 disc booster rec shingrix as well   Pap neg 07/05/17 neg hpvDr. Defrancesco -will do at f/u  mammo neg 01/29/18 -->new order inpt to call to schedule  Colonoscopy2/11/20 normal  sch fasting labsasap when gets insurance 05/2020  -we discussed possible serious and likely etiologies, options for evaluation and workup, limitations of telemedicine visit vs in person visit, treatment, treatment risks and precautions.   I discussed the assessment and treatment plan with the patient. The patient was provided an opportunity to ask questions and all were answered. The patient agreed with the plan and demonstrated an understanding of the  instructions.     Nino Glow McLean-Scocuzza, MD

## 2020-04-13 ENCOUNTER — Telehealth: Payer: Self-pay | Admitting: Internal Medicine

## 2020-04-13 NOTE — Telephone Encounter (Signed)
Pt did not have vm set up to schedule fasting lab first week in December. CPE with Pap after 12/18

## 2020-05-21 ENCOUNTER — Encounter: Payer: Self-pay | Admitting: Internal Medicine

## 2020-09-29 ENCOUNTER — Encounter: Payer: Self-pay | Admitting: Internal Medicine

## 2020-09-29 ENCOUNTER — Ambulatory Visit: Payer: BC Managed Care – PPO | Admitting: Internal Medicine

## 2020-09-29 ENCOUNTER — Other Ambulatory Visit: Payer: Self-pay

## 2020-09-29 VITALS — BP 120/80 | HR 73 | Temp 98.4°F | Ht 68.0 in | Wt 248.0 lb

## 2020-09-29 DIAGNOSIS — Z8249 Family history of ischemic heart disease and other diseases of the circulatory system: Secondary | ICD-10-CM | POA: Diagnosis not present

## 2020-09-29 DIAGNOSIS — K219 Gastro-esophageal reflux disease without esophagitis: Secondary | ICD-10-CM

## 2020-09-29 DIAGNOSIS — G8929 Other chronic pain: Secondary | ICD-10-CM

## 2020-09-29 DIAGNOSIS — D1779 Benign lipomatous neoplasm of other sites: Secondary | ICD-10-CM

## 2020-09-29 DIAGNOSIS — E785 Hyperlipidemia, unspecified: Secondary | ICD-10-CM

## 2020-09-29 DIAGNOSIS — R7303 Prediabetes: Secondary | ICD-10-CM

## 2020-09-29 DIAGNOSIS — Z13818 Encounter for screening for other digestive system disorders: Secondary | ICD-10-CM

## 2020-09-29 DIAGNOSIS — R2242 Localized swelling, mass and lump, left lower limb: Secondary | ICD-10-CM

## 2020-09-29 DIAGNOSIS — I10 Essential (primary) hypertension: Secondary | ICD-10-CM

## 2020-09-29 DIAGNOSIS — M25572 Pain in left ankle and joints of left foot: Secondary | ICD-10-CM

## 2020-09-29 DIAGNOSIS — M545 Low back pain, unspecified: Secondary | ICD-10-CM

## 2020-09-29 DIAGNOSIS — R519 Headache, unspecified: Secondary | ICD-10-CM | POA: Diagnosis not present

## 2020-09-29 DIAGNOSIS — Z1389 Encounter for screening for other disorder: Secondary | ICD-10-CM

## 2020-09-29 DIAGNOSIS — J309 Allergic rhinitis, unspecified: Secondary | ICD-10-CM

## 2020-09-29 DIAGNOSIS — E559 Vitamin D deficiency, unspecified: Secondary | ICD-10-CM

## 2020-09-29 DIAGNOSIS — I672 Cerebral atherosclerosis: Secondary | ICD-10-CM

## 2020-09-29 DIAGNOSIS — Z1329 Encounter for screening for other suspected endocrine disorder: Secondary | ICD-10-CM

## 2020-09-29 DIAGNOSIS — Z0100 Encounter for examination of eyes and vision without abnormal findings: Secondary | ICD-10-CM

## 2020-09-29 DIAGNOSIS — J329 Chronic sinusitis, unspecified: Secondary | ICD-10-CM

## 2020-09-29 DIAGNOSIS — G4700 Insomnia, unspecified: Secondary | ICD-10-CM

## 2020-09-29 DIAGNOSIS — B009 Herpesviral infection, unspecified: Secondary | ICD-10-CM

## 2020-09-29 LAB — CBC WITH DIFFERENTIAL/PLATELET
Basophils Absolute: 0 10*3/uL (ref 0.0–0.1)
Basophils Relative: 0.7 % (ref 0.0–3.0)
Eosinophils Absolute: 0.1 10*3/uL (ref 0.0–0.7)
Eosinophils Relative: 1.4 % (ref 0.0–5.0)
HCT: 40.9 % (ref 36.0–46.0)
Hemoglobin: 13.5 g/dL (ref 12.0–15.0)
Lymphocytes Relative: 51 % — ABNORMAL HIGH (ref 12.0–46.0)
Lymphs Abs: 2.9 10*3/uL (ref 0.7–4.0)
MCHC: 33 g/dL (ref 30.0–36.0)
MCV: 82.7 fl (ref 78.0–100.0)
Monocytes Absolute: 0.7 10*3/uL (ref 0.1–1.0)
Monocytes Relative: 11.6 % (ref 3.0–12.0)
Neutro Abs: 2 10*3/uL (ref 1.4–7.7)
Neutrophils Relative %: 35.3 % — ABNORMAL LOW (ref 43.0–77.0)
Platelets: 177 10*3/uL (ref 150.0–400.0)
RBC: 4.94 Mil/uL (ref 3.87–5.11)
RDW: 13.9 % (ref 11.5–15.5)
WBC: 5.6 10*3/uL (ref 4.0–10.5)

## 2020-09-29 LAB — COMPREHENSIVE METABOLIC PANEL
ALT: 34 U/L (ref 0–35)
AST: 32 U/L (ref 0–37)
Albumin: 4.1 g/dL (ref 3.5–5.2)
Alkaline Phosphatase: 57 U/L (ref 39–117)
BUN: 9 mg/dL (ref 6–23)
CO2: 28 mEq/L (ref 19–32)
Calcium: 9.4 mg/dL (ref 8.4–10.5)
Chloride: 101 mEq/L (ref 96–112)
Creatinine, Ser: 0.85 mg/dL (ref 0.40–1.20)
GFR: 78.84 mL/min (ref >60.00)
Glucose, Bld: 87 mg/dL (ref 70–99)
Potassium: 3.9 mEq/L (ref 3.5–5.1)
Sodium: 136 mEq/L (ref 135–145)
Total Bilirubin: 0.4 mg/dL (ref 0.2–1.2)
Total Protein: 7.2 g/dL (ref 6.0–8.3)

## 2020-09-29 LAB — LIPID PANEL
Cholesterol: 209 mg/dL — ABNORMAL HIGH (ref 0–200)
HDL: 68.6 mg/dL (ref >39.00)
LDL Cholesterol: 124 mg/dL — ABNORMAL HIGH (ref 0–99)
NonHDL: 140.03
Total CHOL/HDL Ratio: 3
Triglycerides: 81 mg/dL (ref 0.0–149.0)
VLDL: 16.2 mg/dL (ref 0.0–40.0)

## 2020-09-29 LAB — SEDIMENTATION RATE: Sed Rate: 19 mm/hr (ref 0–30)

## 2020-09-29 LAB — VITAMIN D 25 HYDROXY (VIT D DEFICIENCY, FRACTURES): VITD: 61.66 ng/mL (ref 30.00–100.00)

## 2020-09-29 LAB — HEMOGLOBIN A1C: Hgb A1c MFr Bld: 6.1 % (ref 4.6–6.5)

## 2020-09-29 LAB — TSH: TSH: 2.95 u[IU]/mL (ref 0.35–4.50)

## 2020-09-29 MED ORDER — LISINOPRIL-HYDROCHLOROTHIAZIDE 10-12.5 MG PO TABS
1.0000 | ORAL_TABLET | Freq: Every day | ORAL | 3 refills | Status: DC
Start: 2020-09-29 — End: 2021-10-01

## 2020-09-29 MED ORDER — FLUTICASONE PROPIONATE 50 MCG/ACT NA SUSP: 1.0000 | Freq: Every day | NASAL | 12 refills | Status: DC

## 2020-09-29 MED ORDER — VALACYCLOVIR HCL 500 MG PO TABS: 500.0000 mg | ORAL_TABLET | Freq: Every day | ORAL | 3 refills | Status: DC

## 2020-09-29 MED ORDER — ATORVASTATIN CALCIUM 10 MG PO TABS: 10.0000 mg | ORAL_TABLET | Freq: Every day | ORAL | 3 refills | Status: DC

## 2020-09-29 MED ORDER — LORATADINE 10 MG PO TABS: 10.0000 mg | ORAL_TABLET | Freq: Every day | ORAL | 3 refills | Status: DC | PRN

## 2020-09-29 MED ORDER — METHOCARBAMOL 500 MG PO TABS: 500.0000 mg | ORAL_TABLET | Freq: Every evening | ORAL | 3 refills | Status: DC | PRN

## 2020-09-29 MED ORDER — SALINE SPRAY 0.65 % NA SOLN: 2.0000 | NASAL | 12 refills | Status: DC | PRN

## 2020-09-29 MED ORDER — PANTOPRAZOLE SODIUM 40 MG PO TBEC: 40.0000 mg | DELAYED_RELEASE_TABLET | Freq: Every day | ORAL | 3 refills | Status: DC

## 2020-09-29 MED ORDER — AMITRIPTYLINE HCL 10 MG PO TABS: 10.0000 mg | ORAL_TABLET | Freq: Every day | ORAL | 3 refills | Status: DC

## 2020-09-29 NOTE — Progress Notes (Signed)
Chief Complaint  Patient presents with  . Follow-up    Constant heatburn/ knot on left ankle/ wt loss options   F/u  1. Heartburn was on prilosec 40 mg qd  2. Left ankle knot and painful since 03/2020 and worse with running  3. Obesity and wants to lose exercising at [employer] and changing diet  4. HTN/hld on lipitor 10 mg qhs, lis-hct 10-12.5 mg qd  5. Episodic temporal h/a will get Q2-3 weeks FH anerysm and mom with brain tumor agreeable to imaging    Review of Systems  Constitutional: Negative for weight loss.  HENT: Negative for hearing loss.   Eyes: Negative for blurred vision.  Respiratory: Negative for shortness of breath.   Cardiovascular: Negative for chest pain.  Gastrointestinal: Negative for abdominal pain.  Musculoskeletal: Positive for joint pain. Negative for falls.  Skin: Negative for rash.  Neurological: Positive for headaches.  Psychiatric/Behavioral: Negative for depression.   Past Medical History:  Diagnosis Date  . Fibroids   . Genital herpes   . Genital warts   . Hyperlipidemia   . Hypertension   . Migraine    Past Surgical History:  Procedure Laterality Date  . COLONOSCOPY WITH PROPOFOL N/A 08/28/2018   Procedure: COLONOSCOPY WITH PROPOFOL;  Surgeon: Lin Landsman, MD;  Location: [facility] ENDOSCOPY;  Service: Gastroenterology;  Laterality: N/A;  . ECTOPIC PREGNANCY SURGERY Left   . EMBOLIZATION     For fibroid  . ESOPHAGOGASTRODUODENOSCOPY (EGD) WITH PROPOFOL N/A 08/28/2018   Procedure: ESOPHAGOGASTRODUODENOSCOPY (EGD) WITH PROPOFOL;  Surgeon: Lin Landsman, MD;  Location: [facility];  Service: Gastroenterology;  Laterality: N/A;   Family History  Problem Relation Age of Onset  . Arthritis Mother   . Diabetes Mother   . Other Mother        brain tumor stroke like sx's  . Diabetes Father   . Cancer Brother   . Cancer Maternal Grandfather   . Breast cancer Maternal Aunt   . Ovarian cancer Maternal Aunt   . Aneurysm Maternal  Grandmother   . Colon cancer Neg Hx    Social History   Socioeconomic History  . Marital status: Single    Spouse name: Not on file  . Number of children: Not on file  . Years of education: Not on file  . Highest education level: Not on file  Occupational History  . Not on file  Tobacco Use  . Smoking status: Former Smoker    Quit date: 2007    Years since quitting: 15.2  . Smokeless tobacco: Never Used  Vaping Use  . Vaping Use: Never used  Substance and Sexual Activity  . Alcohol use: Yes    Alcohol/week: 2.0 standard drinks    Types: 2 Glasses of wine per week    Comment: per 2 weeks   . Drug use: No  . Sexual activity: Yes    Birth control/protection: Condom  Other Topics Concern  . 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 Cholecalciferol 1.25 MG (50000 UT) capsule Take 1 capsule (50,000  Units total) by mouth once a week.  . hydrocortisone 2.5 % ointment Apply topically 2 (two) times daily. Prn ears  . pantoprazole (PROTONIX) 40 MG tablet Take 1 tablet (40 mg total) by mouth daily. 30 min before a meal  . [DISCONTINUED] amitriptyline (ELAVIL) 10 MG tablet Take 1 tablet (10 mg total) by mouth at bedtime.  . [DISCONTINUED] atorvastatin (LIPITOR) 10 MG tablet Take 1 tablet (10 mg total) by mouth daily at 6 PM.  . [DISCONTINUED] fluticasone (FLONASE) 50 MCG/ACT nasal spray Place 1-2 sprays into both nostrils daily.  . [DISCONTINUED] lisinopril-hydrochlorothiazide (ZESTORETIC) 10-12.5 MG tablet Take 1 tablet by mouth daily. In am  . [DISCONTINUED] loratadine (CLARITIN) 10 MG tablet Take 1  tablet (10 mg total) by mouth daily as needed for allergies.  . [DISCONTINUED] methocarbamol (ROBAXIN) 500 MG tablet Take 1 tablet (500 mg total) by mouth at bedtime as needed for muscle spasms.  . [DISCONTINUED] sodium chloride (OCEAN) 0.65 % SOLN nasal spray Place 2 sprays into both nostrils as needed for congestion.  . [DISCONTINUED] valACYclovir (VALTREX) 500 MG tablet Take 1 tablet (500 mg total) by mouth daily. For prevention bid x 3-7 days outbreak   No Known Allergies No results found for this or any previous visit (from the past 2160 hour(s)). Objective  Body mass index is 37.71 kg/m. Wt Readings from Last 3 Encounters:  09/29/20 248 lb (112.5 kg)  04/10/20 227 lb (103 kg)  06/12/19 230 lb (104.3 kg)   Temp Readings from Last 3 Encounters:  09/29/20 98.4 F (36.9 C) (Oral)  08/28/18 97.6 F (36.4 C) (Tympanic)  06/28/18 98.7 F (37.1 C) (Oral)   BP Readings from Last 3 Encounters:  09/29/20 120/80  08/28/18 125/87  08/16/18 104/70   Pulse Readings from Last 3 Encounters:  09/29/20 73  08/28/18 84  08/16/18 80    Physical Exam Vitals and nursing note reviewed.  Constitutional:      Appearance: Normal appearance. She is well-developed and well-groomed. She is obese.  HENT:     Head: Normocephalic and atraumatic.  Eyes:     Conjunctiva/sclera: Conjunctivae normal.     Pupils: Pupils are equal, round, and reactive to light.  Cardiovascular:     Rate and Rhythm: Normal rate and regular rhythm.     Heart sounds: Normal heart sounds. No murmur heard.   Pulmonary:     Effort: Pulmonary effort is normal.     Breath sounds: Normal breath sounds.  Skin:    General: Skin is warm and dry.  Neurological:     General: No focal deficit present.     Mental Status: She is alert and oriented to person, place, and time. Mental status is at baseline.     Gait: Gait normal.  Psychiatric:        Attention and Perception: Attention and perception normal.        Mood and  Affect: Mood and affect normal.        Speech: Speech normal.        Behavior: Behavior normal. Behavior is cooperative.        Thought Content: Thought content normal.        Cognition and Memory: Cognition and memory normal.        Judgment: Judgment normal.     Assessment  Plan  Nonintractable episodic headache, temples r/o TA FH aneurysm and tumor - Plan: MR Brain Wo Contrast, MR Angiogram Head Wo Contrast, Sedimentation rate FH: aneurysm - Plan: MR Brain  Wo Contrast, MR Angiogram Head Wo Contrast -consider neurology in future  Hypertension,controlled - Plan: Comprehensive metabolic panel, Lipid panel, CBC with Differential/Platelet, lisinopril-hydrochlorothiazide (ZESTORETIC) 10-12.5 MG tablet qd  Prediabetes - Plan: Hemoglobin A1c  Ankle mass, left - Plan: Ambulatory referral to Podiatry Dr. Posey Pronto or Amalia Hailey Chronic pain of left ankle - Plan: Ambulatory referral to Podiatry  Gastroesophageal reflux disease without esophagitis - Plan: pantoprazole (PROTONIX) 40 MG tablet  Routine eye exam ?h/o catarcts- Plan: Ambulatory referral to Ophthalmology  HSV infection - Plan: valACYclovir (VALTREX) 500 MG tablet  Sinusitis, unspecified chronicity, unspecified location - Plan: sodium chloride (OCEAN) 0.65 % SOLN nasal spray  Low back pain without sciatica, unspecified back pain laterality, unspecified chronicity - Plan: methocarbamol (ROBAXIN) 500 MG tablet Exercises given   Allergic rhinitis, unspecified seasonality, unspecified trigger - Plan: loratadine (CLARITIN) 10 MG tablet, fluticasone (FLONASE) 50 MCG/ACT nasal spray  Essential hypertension - Plan: lisinopril-hydrochlorothiazide (ZESTORETIC) 10-12.5 MG tablet  Hyperlipidemia, unspecified hyperlipidemia type - Plan: atorvastatin (LIPITOR) 10 MG tablet  Insomnia, unspecified type - Plan: amitriptyline (ELAVIL) 10 MG tablet   HM-pap at f/u Flu shot 06/02/20 Tdap had Hep B3/3immune Pfizer 2/2 disc booster had 3rd dose  pfizer 06/02/20 rec shingrix as well11/16/21, 09/29/20  Pap neg 07/05/17 neg hpvDr. Enzo Bi -will do at f/u   mammo neg 01/29/18 -->new order inpt to call to schedule  Colonoscopy2/11/20 normal  sch fasting labs today  Provider: Dr. Olivia Mackie McLean-Scocuzza-Internal Medicine

## 2020-09-29 NOTE — Patient Instructions (Addendum)
Call to schedule mammogram please [place]  AND  Call insurance for preferred MRI site location   Think about new shingles shot x 2 doses consider and call back if wanted   Referred [facility] left ankle issue Dr. Laymond Purser or Dr. Posey Pronto     Can also try pepcid also for GERD    Low Back Sprain or Strain Rehab Ask your health care provider which exercises are safe for you. Do exercises exactly as told by your health care provider and adjust them as directed. It is normal to feel mild stretching, pulling, tightness, or discomfort as you do these exercises. Stop right away if you feel sudden pain or your pain gets worse. Do not begin these exercises until told by your health care provider. Stretching and range-of-motion exercises These exercises warm up your muscles and joints and improve the movement and flexibility of your back. These exercises also help to relieve pain, numbness, and tingling. Lumbar rotation 1. Lie on your back on a firm surface and bend your knees. 2. Straighten your arms out to your sides so each arm forms a 90-degree angle (right angle) with a side of your body. 3. Slowly move (rotate) both of your knees to one side of your body until you feel a stretch in your lower back (lumbar). Try not to let your shoulders lift off the floor. 4. Hold this position for __________ seconds. 5. Tense your abdominal muscles and slowly move your knees back to the starting position. 6. Repeat this exercise on the other side of your body. Repeat __________ times. Complete this exercise __________ times a day.   Single knee to chest 1. Lie on your back on a firm surface with both legs straight. 2. Bend one of your knees. Use your hands to move your knee up toward your chest until you feel a gentle stretch in your lower back and buttock. ? Hold your leg in this position by holding on to the front of your knee. ? Keep your other leg as straight as possible. 3. Hold this position  for __________ seconds. 4. Slowly return to the starting position. 5. Repeat with your other leg. Repeat __________ times. Complete this exercise __________ times a day.   Prone extension on elbows 1. Lie on your abdomen on a firm surface (prone position). 2. Prop yourself up on your elbows. 3. Use your arms to help lift your chest up until you feel a gentle stretch in your abdomen and your lower back. ? This will place some of your body weight on your elbows. If this is uncomfortable, try stacking pillows under your chest. ? Your hips should stay down, against the surface that you are lying on. Keep your hip and back muscles relaxed. 4. Hold this position for __________ seconds. 5. Slowly relax your upper body and return to the starting position. Repeat __________ times. Complete this exercise __________ times a day.   Strengthening exercises These exercises build strength and endurance in your back. Endurance is the ability to use your muscles for a long time, even after they get tired. Pelvic tilt This exercise strengthens the muscles that lie deep in the abdomen. 1. Lie on your back on a firm surface. Bend your knees and keep your feet flat on the floor. 2. Tense your abdominal muscles. Tip your pelvis up toward the ceiling and flatten your lower back into the floor. ? To help with this exercise, you may place a small towel under your  lower back and try to push your back into the towel. 3. Hold this position for __________ seconds. 4. Let your muscles relax completely before you repeat this exercise. Repeat __________ times. Complete this exercise __________ times a day. Alternating arm and leg raises 1. Get on your hands and knees on a firm surface. If you are on a hard floor, you may want to use padding, such as an exercise mat, to cushion your knees. 2. Line up your arms and legs. Your hands should be directly below your shoulders, and your knees should be directly below your  hips. 3. Lift your left leg behind you. At the same time, raise your right arm and straighten it in front of you. ? Do not lift your leg higher than your hip. ? Do not lift your arm higher than your shoulder. ? Keep your abdominal and back muscles tight. ? Keep your hips facing the ground. ? Do not arch your back. ? Keep your balance carefully, and do not hold your breath. 4. Hold this position for __________ seconds. 5. Slowly return to the starting position. 6. Repeat with your right leg and your left arm. Repeat __________ times. Complete this exercise __________ times a day.   Abdominal set with straight leg raise 1. Lie on your back on a firm surface. 2. Bend one of your knees and keep your other leg straight. 3. Tense your abdominal muscles and lift your straight leg up, 4-6 inches (10-15 cm) off the ground. 4. Keep your abdominal muscles tight and hold this position for __________ seconds. ? Do not hold your breath. ? Do not arch your back. Keep it flat against the ground. 5. Keep your abdominal muscles tense as you slowly lower your leg back to the starting position. 6. Repeat with your other leg. Repeat __________ times. Complete this exercise __________ times a day.   Single leg lower with bent knees 1. Lie on your back on a firm surface. 2. Tense your abdominal muscles and lift your feet off the floor, one foot at a time, so your knees and hips are bent in 90-degree angles (right angles). ? Your knees should be over your hips and your lower legs should be parallel to the floor. 3. Keeping your abdominal muscles tense and your knee bent, slowly lower one of your legs so your toe touches the ground. 4. Lift your leg back up to return to the starting position. ? Do not hold your breath. ? Do not let your back arch. Keep your back flat against the ground. 5. Repeat with your other leg. Repeat __________ times. Complete this exercise __________ times a day. Posture and body  mechanics Good posture and healthy body mechanics can help to relieve stress in your body's tissues and joints. Body mechanics refers to the movements and positions of your body while you do your daily activities. Posture is part of body mechanics. Good posture means:  Your spine is in its natural S-curve position (neutral).  Your shoulders are pulled back slightly.  Your head is not tipped forward. Follow these guidelines to improve your posture and body mechanics in your everyday activities. Standing  When standing, keep your spine neutral and your feet about hip width apart. Keep a slight bend in your knees. Your ears, shoulders, and hips should line up.  When you do a task in which you stand in one place for a long time, place one foot up on a stable object that is 2-4 inches (  5-10 cm) high, such as a footstool. This helps keep your spine neutral.   Sitting  When sitting, keep your spine neutral and keep your feet flat on the floor. Use a footrest, if necessary, and keep your thighs parallel to the floor. Avoid rounding your shoulders, and avoid tilting your head forward.  When working at a desk or a computer, keep your desk at a height where your hands are slightly lower than your elbows. Slide your chair under your desk so you are close enough to maintain good posture.  When working at a computer, place your monitor at a height where you are looking straight ahead and you do not have to tilt your head forward or downward to look at the screen.   Resting  When lying down and resting, avoid positions that are most painful for you.  If you have pain with activities such as sitting, bending, stooping, or squatting, lie in a position in which your body does not bend very much. For example, avoid curling up on your side with your arms and knees near your chest (fetal position).  If you have pain with activities such as standing for a long time or reaching with your arms, lie with your  spine in a neutral position and bend your knees slightly. Try the following positions: ? Lying on your side with a pillow between your knees. ? Lying on your back with a pillow under your knees. Lifting  When lifting objects, keep your feet at least shoulder width apart and tighten your abdominal muscles.  Bend your knees and hips and keep your spine neutral. It is important to lift using the strength of your legs, not your back. Do not lock your knees straight out.  Always ask for help to lift heavy or awkward objects.   This information is not intended to replace advice given to you by your health care provider. Make sure you discuss any questions you have with your health care provider. Document Revised: 10/26/2018 Document Reviewed: 07/26/2018 Elsevier Patient Education  2021 [place].  Back Exercises The following exercises strengthen the muscles that help to support the trunk and back. They also help to keep the lower back flexible. Doing these exercises can help to prevent back pain or lessen existing pain.  If you have back pain or discomfort, try doing these exercises 2-3 times each day or as told by your health care provider.  As your pain improves, do them once each day, but increase the number of times that you repeat the steps for each exercise (do more repetitions).  To prevent the recurrence of back pain, continue to do these exercises once each day or as told by your health care provider. Do exercises exactly as told by your health care provider and adjust them as directed. It is normal to feel mild stretching, pulling, tightness, or discomfort as you do these exercises, but you should stop right away if you feel sudden pain or your pain gets worse. Exercises Single knee to chest Repeat these steps 3-5 times for each leg: 1. Lie on your back on a firm bed or the floor with your legs extended. 2. Bring one knee to your chest. Your other leg should stay extended and in  contact with the floor. 3. Hold your knee in place by grabbing your knee or thigh with both hands and hold. 4. Pull on your knee until you feel a gentle stretch in your lower back or buttocks. 5. Hold  the stretch for 10-30 seconds. 6. Slowly release and straighten your leg. Pelvic tilt Repeat these steps 5-10 times: 1. Lie on your back on a firm bed or the floor with your legs extended. 2. Bend your knees so they are pointing toward the ceiling and your feet are flat on the floor. 3. Tighten your lower abdominal muscles to press your lower back against the floor. This motion will tilt your pelvis so your tailbone points up toward the ceiling instead of pointing to your feet or the floor. 4. With gentle tension and even breathing, hold this position for 5-10 seconds. Cat-cow Repeat these steps until your lower back becomes more flexible: 1. Get into a hands-and-knees position on a firm surface. Keep your hands under your shoulders, and keep your knees under your hips. You may place padding under your knees for comfort. 2. Let your head hang down toward your chest. Contract your abdominal muscles and point your tailbone toward the floor so your lower back becomes rounded like the back of a cat. 3. Hold this position for 5 seconds. 4. Slowly lift your head, let your abdominal muscles relax and point your tailbone up toward the ceiling so your back forms a sagging arch like the back of a cow. 5. Hold this position for 5 seconds.   Press-ups Repeat these steps 5-10 times: 1. Lie on your abdomen (face-down) on the floor. 2. Place your palms near your head, about shoulder-width apart. 3. Keeping your back as relaxed as possible and keeping your hips on the floor, slowly straighten your arms to raise the top half of your body and lift your shoulders. Do not use your back muscles to raise your upper torso. You may adjust the placement of your hands to make yourself more comfortable. 4. Hold this  position for 5 seconds while you keep your back relaxed. 5. Slowly return to lying flat on the floor.   Bridges Repeat these steps 10 times: 1. Lie on your back on a firm surface. 2. Bend your knees so they are pointing toward the ceiling and your feet are flat on the floor. Your arms should be flat at your sides, next to your body. 3. Tighten your buttocks muscles and lift your buttocks off the floor until your waist is at almost the same height as your knees. You should feel the muscles working in your buttocks and the back of your thighs. If you do not feel these muscles, slide your feet 1-2 inches farther away from your buttocks. 4. Hold this position for 3-5 seconds. 5. Slowly lower your hips to the starting position, and allow your buttocks muscles to relax completely. If this exercise is too easy, try doing it with your arms crossed over your chest.   Abdominal crunches Repeat these steps 5-10 times: 1. Lie on your back on a firm bed or the floor with your legs extended. 2. Bend your knees so they are pointing toward the ceiling and your feet are flat on the floor. 3. Cross your arms over your chest. 4. Tip your chin slightly toward your chest without bending your neck. 5. Tighten your abdominal muscles and slowly raise your trunk (torso) high enough to lift your shoulder blades a tiny bit off the floor. Avoid raising your torso higher than that because it can put too much stress on your low back and does not help to strengthen your abdominal muscles. 6. Slowly return to your starting position. Back lifts Repeat these steps 5-10  times: 1. Lie on your abdomen (face-down) with your arms at your sides, and rest your forehead on the floor. 2. Tighten the muscles in your legs and your buttocks. 3. Slowly lift your chest off the floor while you keep your hips pressed to the floor. Keep the back of your head in line with the curve in your back. Your eyes should be looking at the floor. 4. Hold  this position for 3-5 seconds. 5. Slowly return to your starting position. Contact a health care provider if:  Your back pain or discomfort gets much worse when you do an exercise.  Your worsening back pain or discomfort does not lessen within 2 hours after you exercise. If you have any of these problems, stop doing these exercises right away. Do not do them again unless your health care provider says that you can. Get help right away if:  You develop sudden, severe back pain. If this happens, stop doing the exercises right away. Do not do them again unless your health care provider says that you can. This information is not intended to replace advice given to you by your health care provider. Make sure you discuss any questions you have with your health care provider. Document Revised: 11/08/2018 Document Reviewed: 04/05/2018 Elsevier Patient Education  2021 [place].   Gastroesophageal Reflux Disease, Adult Gastroesophageal reflux (GER) happens when acid from the stomach flows up into the tube that connects the mouth and the stomach (esophagus). Normally, food travels down the esophagus and stays in the stomach to be digested. However, when a person has GER, food and stomach acid sometimes move back up into the esophagus. If this becomes a more serious problem, the person may be diagnosed with a disease called gastroesophageal reflux disease (GERD). GERD occurs when the reflux:  Happens often.  Causes frequent or severe symptoms.  Causes problems such as damage to the esophagus. When stomach acid comes in contact with the esophagus, the acid may cause inflammation in the esophagus. Over time, GERD may create small holes (ulcers) in the lining of the esophagus. What are the causes? This condition is caused by a problem with the muscle between the esophagus and the stomach (lower esophageal sphincter, or LES). Normally, the LES muscle closes after food passes through the esophagus to  the stomach. When the LES is weakened or abnormal, it does not close properly, and that allows food and stomach acid to go back up into the esophagus. The LES can be weakened by certain dietary substances, medicines, and medical conditions, including:  Tobacco use.  Pregnancy.  Having a hiatal hernia.  Alcohol use.  Certain foods and beverages, such as coffee, chocolate, onions, and peppermint. What increases the risk? You are more likely to develop this condition if you:  Have an increased body weight.  Have a connective tissue disorder.  Take NSAIDs, such as ibuprofen. What are the signs or symptoms? Symptoms of this condition include:  Heartburn.  Difficult or painful swallowing and the feeling of having a lump in the throat.  A bitter taste in the mouth.  Bad breath and having a large amount of saliva.  Having an upset or bloated stomach and belching.  Chest pain. Different conditions can cause chest pain. Make sure you see your health care provider if you experience chest pain.  Shortness of breath or wheezing.  Ongoing (chronic) cough or a nighttime cough.  Wearing away of tooth enamel.  Weight loss. How is this diagnosed? This condition may  be diagnosed based on a medical history and a physical exam. To determine if you have mild or severe GERD, your health care provider may also monitor how you respond to treatment. You may also have tests, including:  A test to examine your stomach and esophagus with a small camera (endoscopy).  A test that measures the acidity level in your esophagus.  A test that measures how much pressure is on your esophagus.  A barium swallow or modified barium swallow test to show the shape, size, and functioning of your esophagus. How is this treated? Treatment for this condition may vary depending on how severe your symptoms are. Your health care provider may recommend:  Changes to your diet.  Medicine.  Surgery. The goal  of treatment is to help relieve your symptoms and to prevent complications. Follow these instructions at home: Eating and drinking  Follow a diet as recommended by your health care provider. This may involve avoiding foods and drinks such as: ? Coffee and tea, with or without caffeine. ? Drinks that contain alcohol. ? Energy drinks and sports drinks. ? Carbonated drinks or sodas. ? Chocolate and cocoa. ? Peppermint and mint flavorings. ? Garlic and onions. ? Horseradish. ? Spicy and acidic foods, including peppers, chili powder, curry powder, vinegar, hot sauces, and barbecue sauce. ? Citrus fruit juices and citrus fruits, such as oranges, lemons, and limes. ? Tomato-based foods, such as red sauce, chili, salsa, and pizza with red sauce. ? Fried and fatty foods, such as donuts, french fries, potato chips, and high-fat dressings. ? High-fat meats, such as hot dogs and fatty cuts of red and white meats, such as rib eye steak, sausage, ham, and bacon. ? High-fat dairy items, such as whole milk, butter, and cream cheese.  Eat small, frequent meals instead of large meals.  Avoid drinking large amounts of liquid with your meals.  Avoid eating meals during the 2-3 hours before bedtime.  Avoid lying down right after you eat.  Do not exercise right after you eat.   Lifestyle  Do not use any products that contain nicotine or tobacco. These products include cigarettes, chewing tobacco, and vaping devices, such as e-cigarettes. If you need help quitting, ask your health care provider.  Try to reduce your stress by using methods such as yoga or meditation. If you need help reducing stress, ask your health care provider.  If you are overweight, reduce your weight to an amount that is healthy for you. Ask your health care provider for guidance about a safe weight loss goal.   General instructions  Pay attention to any changes in your symptoms.  Take over-the-counter and prescription  medicines only as told by your health care provider. Do not take aspirin, ibuprofen, or other NSAIDs unless your health care provider told you to take these medicines.  Wear loose-fitting clothing. Do not wear anything tight around your waist that causes pressure on your abdomen.  Raise (elevate) the head of your bed about 6 inches (15 cm). You can use a wedge to do this.  Avoid bending over if this makes your symptoms worse.  Keep all follow-up visits. This is important. Contact a health care provider if:  You have: ? New symptoms. ? Unexplained weight loss. ? Difficulty swallowing or it hurts to swallow. ? Wheezing or a persistent cough. ? A hoarse voice.  Your symptoms do not improve with treatment. Get help right away if:  You have sudden pain in your arms, neck, jaw, teeth,  or back.  You suddenly feel sweaty, dizzy, or light-headed.  You have chest pain or shortness of breath.  You vomit and the vomit is green, yellow, or black, or it looks like blood or coffee grounds.  You faint.  You have stool that is red, bloody, or black.  You cannot swallow, drink, or eat. These symptoms may represent a serious problem that is an emergency. Do not wait to see if the symptoms will go away. Get medical help right away. Call your local emergency services (911 in the [place] Do not drive yourself to the hospital. Summary  Gastroesophageal reflux happens when acid from the stomach flows up into the esophagus. GERD is a disease in which the reflux happens often, causes frequent or severe symptoms, or causes problems such as damage to the esophagus.  Treatment for this condition may vary depending on how severe your symptoms are. Your health care provider may recommend diet and lifestyle changes, medicine, or surgery.  Contact a health care provider if you have new or worsening symptoms.  Take over-the-counter and prescription medicines only as told by your health care provider. Do not  take aspirin, ibuprofen, or other NSAIDs unless your health care provider told you to do so.  Keep all follow-up visits as told by your health care provider. This is important. This information is not intended to replace advice given to you by your health care provider. Make sure you discuss any questions you have with your health care provider. Document Revised: 01/13/2020 Document Reviewed: 01/13/2020 Elsevier Patient Education  2021 [place] for Gastroesophageal Reflux Disease, Adult When you have gastroesophageal reflux disease (GERD), the foods you eat and your eating habits are very important. Choosing the right foods can help ease the discomfort of GERD. Consider working with a dietitian to help you make healthy food choices. What are tips for following this plan? Reading food labels  Look for foods that are low in saturated fat. Foods that have less than 5% of daily value (DV) of fat and 0 g of trans fats may help with your symptoms. Cooking  Cook foods using methods other than frying. This may include baking, steaming, grilling, or broiling. These are all methods that do not need a lot of fat for cooking.  To add flavor, try to use herbs that are low in spice and acidity. Meal planning  Choose healthy foods that are low in fat, such as fruits, vegetables, whole grains, low-fat dairy products, lean meats, fish, and poultry.  Eat frequent, small meals instead of three large meals each day. Eat your meals slowly, in a relaxed setting. Avoid bending over or lying down until 2-3 hours after eating.  Limit high-fat foods such as fatty meats or fried foods.  Limit your intake of fatty foods, such as oils, butter, and shortening.  Avoid the following as told by your health care provider: ? Foods that cause symptoms. These may be different for different people. Keep a food diary to keep track of foods that cause symptoms. ? Alcohol. ? Drinking large amounts of  liquid with meals. ? Eating meals during the 2-3 hours before bed.   Lifestyle  Maintain a healthy weight. Ask your health care provider what weight is healthy for you. If you need to lose weight, work with your health care provider to do so safely.  Exercise for at least 30 minutes on 5 or more days each week, or as told by your health care provider.  Avoid wearing clothes that fit tightly around your waist and chest.  Do not use any products that contain nicotine or tobacco. These products include cigarettes, chewing tobacco, and vaping devices, such as e-cigarettes. If you need help quitting, ask your health care provider.  Sleep with the head of your bed raised. Use a wedge under the mattress or blocks under the bed frame to raise the head of the bed.  Chew sugar-free gum after mealtimes. What foods should I eat? Eat a healthy, well-balanced diet of fruits, vegetables, whole grains, low-fat dairy products, lean meats, fish, and poultry. Each person is different. Foods that may trigger symptoms in one person may not trigger any symptoms in another person. Work with your health care provider to identify foods that are safe for you. The items listed above may not be a complete list of recommended foods and beverages. Contact a dietitian for more information.   What foods should I avoid? Limiting some of these foods may help manage the symptoms of GERD. Everyone is different. Consult a dietitian or your health care provider to help you identify the exact foods to avoid, if any. Fruits Any fruits prepared with added fat. Any fruits that cause symptoms. For some people this may include citrus fruits, such as oranges, grapefruit, pineapple, and lemons. Vegetables Deep-fried vegetables. [place] fries. Any vegetables prepared with added fat. Any vegetables that cause symptoms. For some people, this may include tomatoes and tomato products, chili peppers, onions and garlic, and  horseradish. Grains Pastries or quick breads with added fat. Meats and other proteins High-fat meats, such as fatty beef or pork, hot dogs, ribs, ham, sausage, salami, and bacon. Fried meat or protein, including fried fish and fried chicken. Nuts and nut butters, in large amounts. Dairy Whole milk and chocolate milk. Sour cream. Cream. Ice cream. Cream cheese. Milkshakes. Fats and oils Butter. Margarine. Shortening. Ghee. Beverages Coffee and tea, with or without caffeine. Carbonated beverages. Sodas. Energy drinks. Fruit juice made with acidic fruits, such as orange or grapefruit. Tomato juice. Alcoholic drinks. Sweets and desserts Chocolate and cocoa. Donuts. Seasonings and condiments Pepper. Peppermint and spearmint. Added salt. Any condiments, herbs, or seasonings that cause symptoms. For some people, this may include curry, hot sauce, or vinegar-based salad dressings. The items listed above may not be a complete list of foods and beverages to avoid. Contact a dietitian for more information. Questions to ask your health care provider Diet and lifestyle changes are usually the first steps that are taken to manage symptoms of GERD. If diet and lifestyle changes do not improve your symptoms, talk with your health care provider about taking medicines. Where to find more information  International Foundation for Gastrointestinal Disorders: aboutgerd.org Summary  When you have gastroesophageal reflux disease (GERD), food and lifestyle choices may be very helpful in easing the discomfort of GERD.  Eat frequent, small meals instead of three large meals each day. Eat your meals slowly, in a relaxed setting. Avoid bending over or lying down until 2-3 hours after eating.  Limit high-fat foods such as fatty meats or fried foods. This information is not intended to replace advice given to you by your health care provider. Make sure you discuss any questions you have with your health care  provider. Document Revised: 01/13/2020 Document Reviewed: 01/13/2020 Elsevier Patient Education  2021 [employer].  Zoster Vaccine, Recombinant injection What is this medicine? ZOSTER VACCINE (ZOS ter vak SEEN) is a vaccine used to reduce the risk of getting shingles.  This vaccine is not used to treat shingles or nerve pain from shingles. This medicine may be used for other purposes; ask your health care provider or pharmacist if you have questions. COMMON BRAND NAME(S): [facility] What should I tell my health care provider before I take this medicine? They need to know if you have any of these conditions:  cancer  immune system problems  an unusual or allergic reaction to Zoster vaccine, other medications, foods, dyes, or preservatives  pregnant or trying to get pregnant  breast-feeding How should I use this medicine? This vaccine is injected into a muscle. It is given by a health care provider. A copy of Vaccine Information Statements will be given before each vaccination. Be sure to read this information carefully each time. This sheet may change often. Talk to your health care provider about the use of this vaccine in children. This vaccine is not approved for use in children. Overdosage: If you think you have taken too much of this medicine contact a poison control center or emergency room at once. NOTE: This medicine is only for you. Do not share this medicine with others. What if I miss a dose? Keep appointments for follow-up (booster) doses. It is important not to miss your dose. Call your health care provider if you are unable to keep an appointment. What may interact with this medicine?  medicines that suppress your immune system  medicines to treat cancer  steroid medicines like prednisone or cortisone This list may not describe all possible interactions. Give your health care provider a list of all the medicines, herbs, non-prescription drugs, or dietary supplements  you use. Also tell them if you smoke, drink alcohol, or use illegal drugs. Some items may interact with your medicine. What should I watch for while using this medicine? Visit your health care provider regularly. This vaccine, like all vaccines, may not fully protect everyone. What side effects may I notice from receiving this medicine? Side effects that you should report to your doctor or health care professional as soon as possible:  allergic reactions (skin rash, itching or hives; swelling of the face, lips, or tongue)  trouble breathing Side effects that usually do not require medical attention (report these to your doctor or health care professional if they continue or are bothersome):  chills  headache  fever  nausea  pain, redness, or irritation at site where injected  tiredness  vomiting This list may not describe all possible side effects. Call your doctor for medical advice about side effects. You may report side effects to FDA at 1-800-FDA-1088. Where should I keep my medicine? This vaccine is only given by a health care provider. It will not be stored at home. NOTE: This sheet is a summary. It may not cover all possible information. If you have questions about this medicine, talk to your doctor, pharmacist, or health care provider.  2021 Elsevier/Gold Standard (2019-08-09 16:23:07)

## 2020-09-30 LAB — URINALYSIS, ROUTINE W REFLEX MICROSCOPIC
Bilirubin, UA: NEGATIVE
Glucose, UA: NEGATIVE
Ketones, UA: NEGATIVE
Leukocytes,UA: NEGATIVE
Nitrite, UA: NEGATIVE
Protein,UA: NEGATIVE
RBC, UA: NEGATIVE
Specific Gravity, UA: 1.012 (ref 1.005–1.030)
Urobilinogen, Ur: 0.2 mg/dL (ref 0.2–1.0)
pH, UA: 8 — ABNORMAL HIGH (ref 5.0–7.5)

## 2020-09-30 LAB — HEPATITIS C ANTIBODY
Hepatitis C Ab: NONREACTIVE
SIGNAL TO CUT-OFF: 0.01 (ref <1.00)

## 2020-10-14 ENCOUNTER — Ambulatory Visit: Payer: BC Managed Care – PPO

## 2020-10-14 ENCOUNTER — Other Ambulatory Visit: Payer: Self-pay

## 2020-10-14 ENCOUNTER — Ambulatory Visit
Admission: RE | Admit: 2020-10-14 | Discharge: 2020-10-14 | Disposition: A | Discharge status: Home / Self Care | Payer: BC Managed Care – PPO | Source: Ambulatory Visit | Attending: Internal Medicine | Admitting: Internal Medicine

## 2020-10-14 DIAGNOSIS — R519 Headache, unspecified: Secondary | ICD-10-CM | POA: Diagnosis present

## 2020-10-14 DIAGNOSIS — Z8249 Family history of ischemic heart disease and other diseases of the circulatory system: Secondary | ICD-10-CM | POA: Diagnosis present

## 2020-10-14 IMAGING — MR MR HEAD W/O CM
12 series · 43 of 48 positions shown · non-contrast
Comparison: None.

CLINICAL DATA: Chronic temporal headaches. Family history of brain
tumor and aneurysm.

EXAM:
MRI HEAD WITHOUT CONTRAST
MRA HEAD WITHOUT CONTRAST
TECHNIQUE: Multiplanar, multiecho pulse sequences of the brain and surrounding
structures were obtained without intravenous contrast. Angiographic
images of the head were obtained using MRA technique without
contrast.

[Series 5: ax dwi_tracew · axial · 3.0mm · 0.65mm/px · z∈[-106,+49]mm · 4 of 48 slices shown]
[im 1/48]
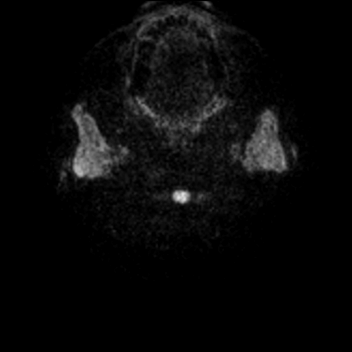
[im 16/48]
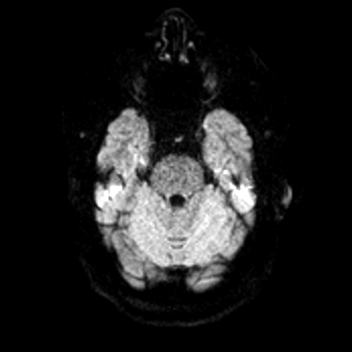
[im 32/48]
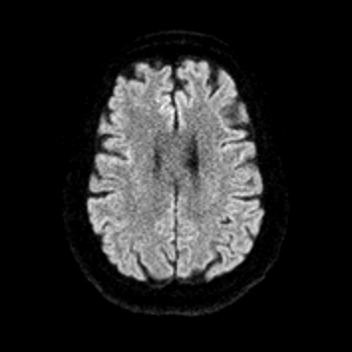
[im 48/48]
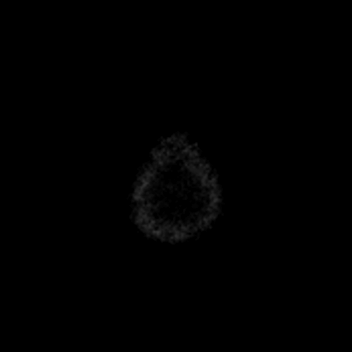

[Series 6: ax dwi_adc · axial · 3.0mm · 0.65mm/px · z∈[-106,+49]mm · 4 of 48 slices shown]
[im 1/48]
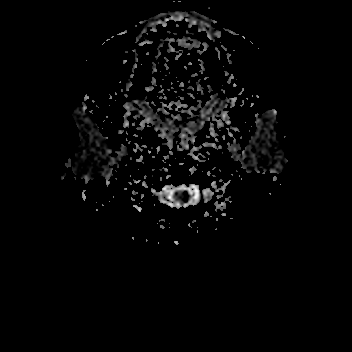
[im 16/48]
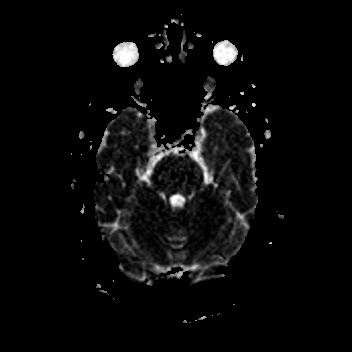
[im 32/48]
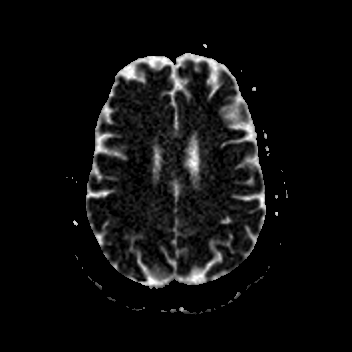
[im 48/48]
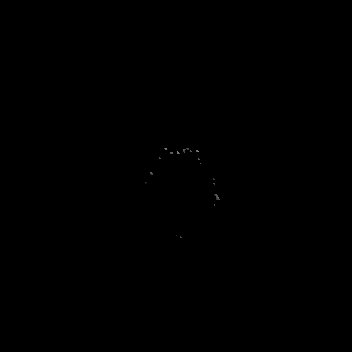

[Series 7: cor dwi_tracew · coronal · 5.0mm · 0.60mm/px · 3 of 38 slices shown]
[im 1/38]
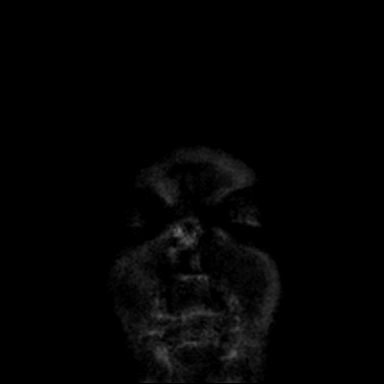
[im 19/38]
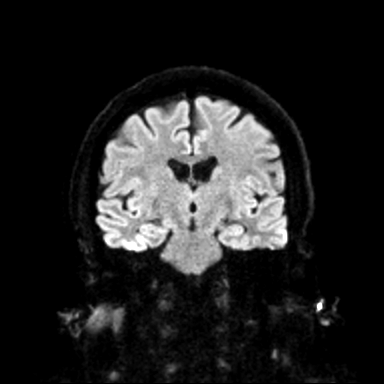
[im 38/38]
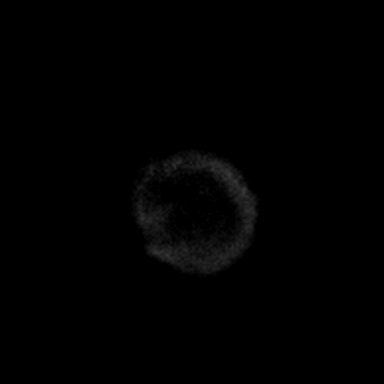

[Series 8: cor dwi_adc · coronal · 5.0mm · 0.60mm/px · 3 of 38 slices shown]
[im 1/38]
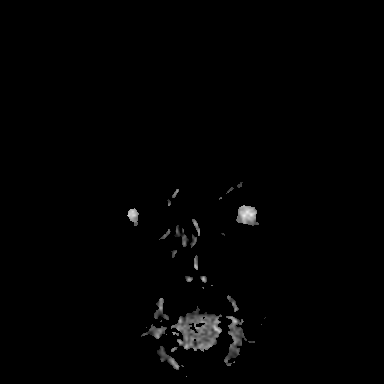
[im 19/38]
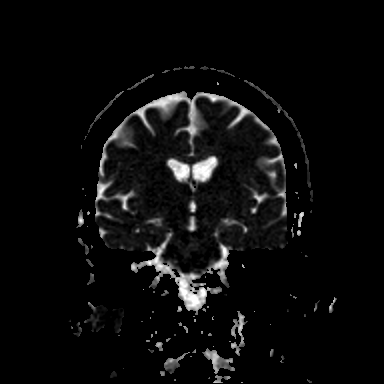
[im 38/38]
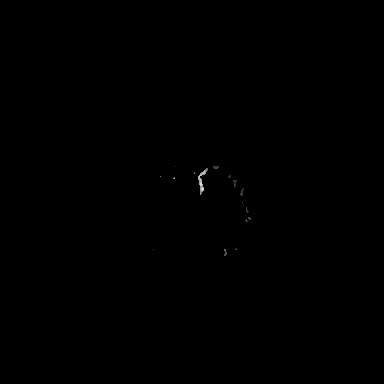

[Series 9: T1 · sagittal · 5.0mm · 0.62mm/px · 2 of 25 slices shown (1 of 2)]
[im 1/25]
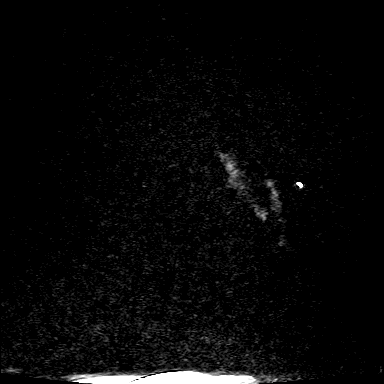
[im 25/25]
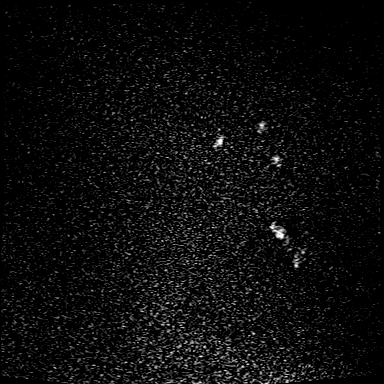

[Series 10: T2 · axial · 5.0mm · 0.53mm/px · z∈[-99,+45]mm · 2 of 25 slices shown (1 of 2)]
[im 1/25]
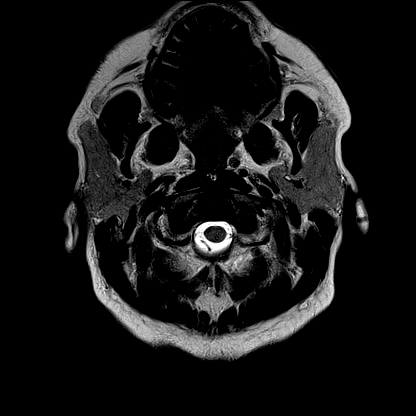
[im 25/25]
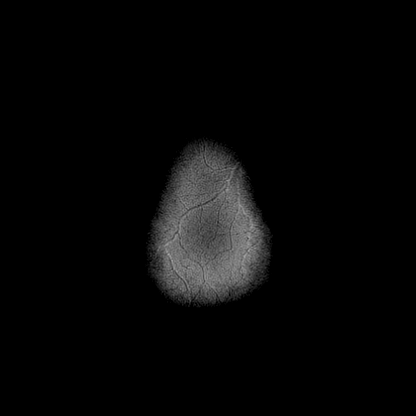

[Series 11: mag_images · axial · 3.0mm · 0.90mm/px · z∈[-116,+61]mm · 4 of 60 slices shown]
[im 1/60]
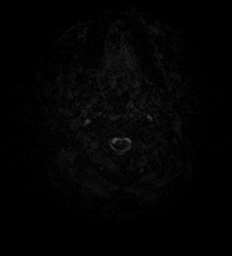
[im 20/60]
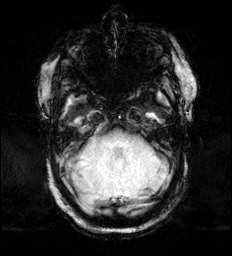
[im 40/60]
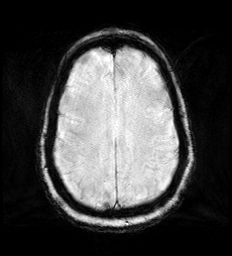
[im 60/60]
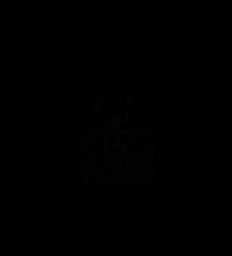

[Series 12: pha_images · axial · 3.0mm · 0.90mm/px · z∈[-116,+61]mm · 4 of 60 slices shown]
[im 1/60]
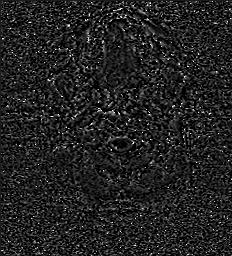
[im 20/60]
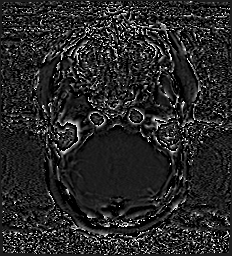
[im 40/60]
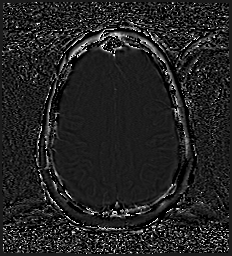
[im 60/60]
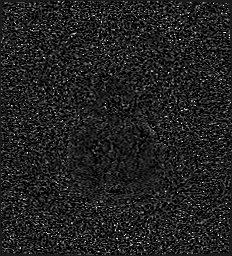

[Series 13: swi_images · axial · 3.0mm · 0.90mm/px · z∈[-116,+1]mm · 3 of 60 slices shown]
[im 1/60]
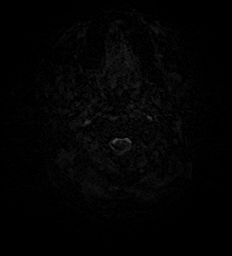
[im 20/60]
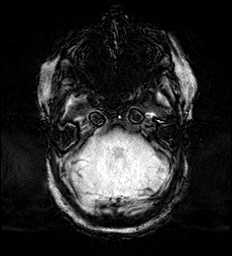
[im 40/60]
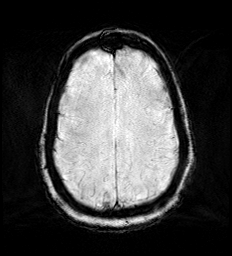

[Series 15: FLAIR · axial · 3.0mm · 0.53mm/px · z∈[-108,+54]mm · 4 of 55 slices shown]
[im 1/55]
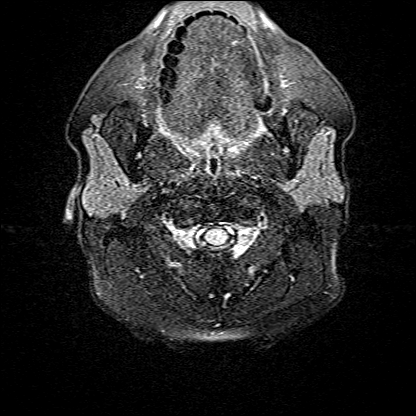
[im 19/55]
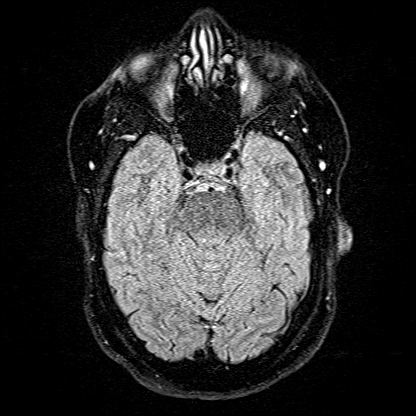
[im 37/55]
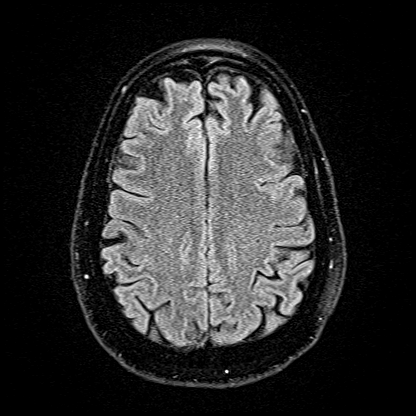
[im 55/55]
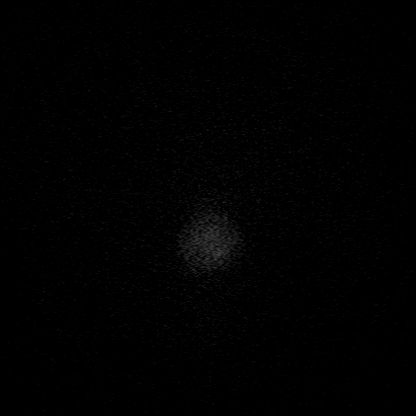

[Series 16: T1 · axial · 1.0mm · 0.98mm/px · z∈[-115,+44]mm · 8 of 160 slices shown (2 of 2)]
[im 1/160]
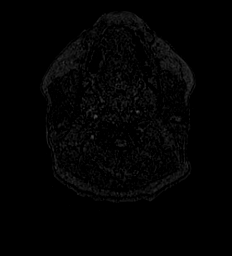
[im 29/160]
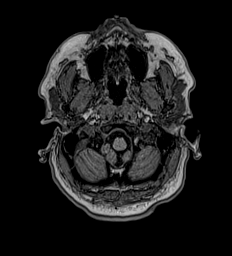
[im 44/160]
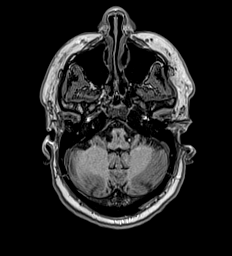
[im 73/160]
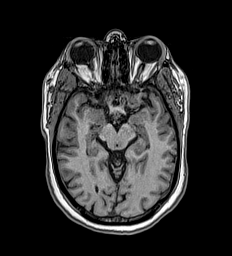
[im 87/160]
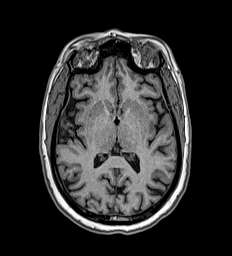
[im 116/160]
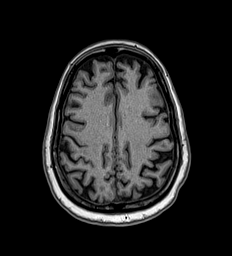
[im 131/160]
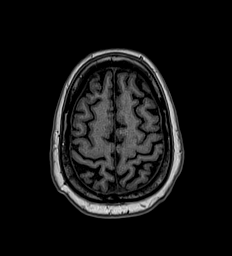
[im 160/160]
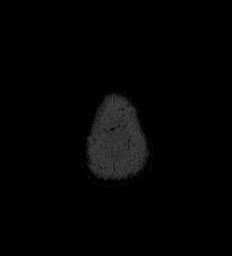

[Series 17: T2 · coronal · 5.0mm · 0.57mm/px · 2 of 29 slices shown (2 of 2)]
[im 1/29]
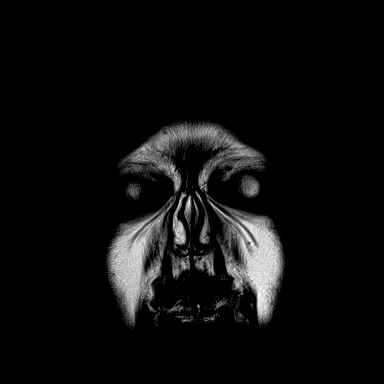
[im 29/29]
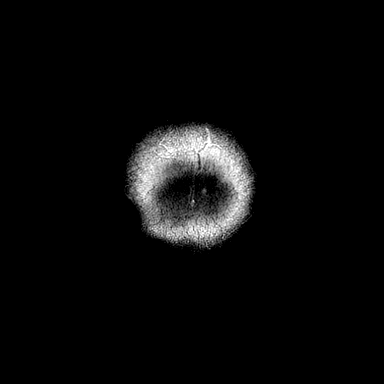

[43 of 48 positions shown; findings below may reference images not displayed]

FINDINGS: MRI HEAD FINDINGS

Brain: There is no evidence of an acute infarct, intracranial
hemorrhage, midline shift, or extra-axial fluid collection. The
ventricles and sulci are normal. The brain itself is normal in
signal. There is a 3 mm extra-axial focus of intrinsic T1
hyperintensity in the left cerebellopontine angle cistern most
consistent with a lipoma without associated mass effect. There is no
cerebellar tonsillar ectopia.

Vascular: Major intracranial vascular flow voids are preserved.

Skull and upper cervical spine: Unremarkable bone marrow signal.

Sinuses/Orbits: Unremarkable orbits. Paranasal sinuses and mastoid
air cells are clear.

Other: None.

MRA HEAD FINDINGS

The included distal most aspects of both vertebral arteries are
patent to the basilar. The basilar artery is widely patent with an
incidental fenestration noted proximally. There is a fetal origin of
the right PCA. Both PCAs are patent without evidence of a
significant proximal stenosis.

The internal carotid arteries are patent from skull base to carotid
termini without evidence a significant stenosis. ACAs and MCAs are
patent without evidence of a proximal branch occlusion or
significant M1 stenosis. There is a moderate stenosis of the right
M2 inferior division proximally. There is mild-to-moderate
irregularity of more distal ACA and MCA branch vessels bilaterally.
No aneurysm is identified.
IMPRESSION: 1. 3 mm lipoma in the left cerebellopontine angle cistern, otherwise
unremarkable appearance of the brain.
2. No major intracranial arterial occlusion or aneurysm.
3. Evidence of intracranial atherosclerosis including a moderate
proximal right M2 stenosis.

## 2020-10-15 ENCOUNTER — Encounter: Payer: Self-pay | Admitting: Internal Medicine

## 2020-10-15 DIAGNOSIS — I672 Cerebral atherosclerosis: Secondary | ICD-10-CM | POA: Insufficient documentation

## 2020-10-15 DIAGNOSIS — D1779 Benign lipomatous neoplasm of other sites: Secondary | ICD-10-CM | POA: Insufficient documentation

## 2020-10-25 NOTE — Addendum Note (Signed)
Addended by: Orland Mustard on: 10/25/2020 10:27 PM   Modules accepted: Orders

## 2020-10-27 ENCOUNTER — Ambulatory Visit: Payer: BC Managed Care – PPO | Admitting: Podiatry

## 2020-12-09 ENCOUNTER — Encounter: Payer: Self-pay | Admitting: Internal Medicine

## 2020-12-09 ENCOUNTER — Other Ambulatory Visit: Payer: Self-pay | Admitting: Internal Medicine

## 2021-01-11 ENCOUNTER — Telehealth: Payer: Self-pay | Admitting: *Deleted

## 2021-01-11 ENCOUNTER — Encounter: Payer: Self-pay | Admitting: Diagnostic Neuroimaging

## 2021-01-11 ENCOUNTER — Ambulatory Visit: Payer: BC Managed Care – PPO | Admitting: Diagnostic Neuroimaging

## 2021-01-11 VITALS — BP 122/81 | HR 69 | Ht 68.0 in | Wt 243.6 lb

## 2021-01-11 DIAGNOSIS — G43109 Migraine with aura, not intractable, without status migrainosus: Secondary | ICD-10-CM | POA: Diagnosis not present

## 2021-01-11 MED ORDER — RIZATRIPTAN BENZOATE 10 MG PO TBDP: 10.0000 mg | ORAL_TABLET | ORAL | 11 refills | Status: DC | PRN

## 2021-01-11 MED ORDER — TOPIRAMATE 50 MG PO TABS: 50.0000 mg | ORAL_TABLET | Freq: Two times a day (BID) | ORAL | 12 refills | Status: DC

## 2021-01-11 NOTE — Progress Notes (Signed)
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  Patient started having headaches in her 63s with left-sided throbbing headaches, photophobia, lightheadedness, seeing spots and sparkles.  Headache can last hours at a time.  She has tried Excedrin PM over-the-counter with mild relief.  She was started on amitriptyline a few months ago without significant benefit.  Family history of migraine in her brother and daughter.  Triggering factors include stress, computer use and lack of sleep.  She averages about 4 to 5 hours of sleep at night.  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  Patient used to live in [place] and moved to [place] around 2017.    REVIEW OF SYSTEMS: Full 14 system review of systems performed and negative with exception of: as per HPI/.  ALLERGIES: No Known Allergies  HOME MEDICATIONS: Outpatient Medications Prior to Visit  Medication Sig Dispense Refill   amitriptyline (ELAVIL) 10 MG tablet Take 1 tablet (10 mg total) by mouth at bedtime. 90 tablet 3   fluticasone (FLONASE) 50 MCG/ACT nasal spray Place 1-2 sprays into both nostrils daily. 16 g 12   hydrocortisone 2.5 % ointment Apply topically 2 (two) times daily. Prn ears 60 g 11   lisinopril-hydrochlorothiazide (ZESTORETIC) 10-12.5 MG tablet Take 1 tablet by mouth daily. In am 90 tablet 3   loratadine (CLARITIN) 10 MG tablet Take 1 tablet (10 mg total) by mouth daily as needed  for allergies. 90 tablet 3   methocarbamol (ROBAXIN) 500 MG tablet Take 1 tablet (500 mg total) by mouth at bedtime as needed for muscle spasms. 90 tablet 3   pantoprazole (PROTONIX) 40 MG tablet Take 1 tablet (40 mg total) by mouth daily. 30 min before a meal 90 tablet 3   sodium chloride (OCEAN) 0.65 % SOLN nasal spray Place 2 sprays into both nostrils as needed for congestion. 30 mL 12   valACYclovir (VALTREX) 500 MG tablet Take 1 tablet (500 mg total) by mouth daily. For prevention bid x 3-7 days outbreak 90 tablet 3   atorvastatin (LIPITOR) 10 MG tablet Take 1 tablet (10 mg total) by mouth daily at 6 PM. (Patient not taking: Reported on 01/11/2021) 90 tablet 3   No facility-administered medications prior to visit.    PAST MEDICAL HISTORY: Past Medical History:  Diagnosis Date   Fibroids    Genital herpes    Genital warts    Hyperlipidemia    Hypertension    Migraine     PAST SURGICAL HISTORY: Past Surgical History:  Procedure Laterality Date   COLONOSCOPY WITH PROPOFOL N/A 08/28/2018   Procedure: COLONOSCOPY WITH PROPOFOL;  Surgeon: Lin Landsman, MD;  Location: [facility] ENDOSCOPY;  Service: Gastroenterology;  Laterality: N/A;   ECTOPIC PREGNANCY SURGERY Left    EMBOLIZATION     For fibroid   ESOPHAGOGASTRODUODENOSCOPY (EGD) WITH PROPOFOL N/A 08/28/2018   Procedure: ESOPHAGOGASTRODUODENOSCOPY (EGD) WITH PROPOFOL;  Surgeon: Lin Landsman, MD;  Location: [facility];  Service: Gastroenterology;  Laterality: N/A;    FAMILY HISTORY: Family History  Problem Relation Age of Onset   Arthritis Mother    Diabetes Mother    Other Mother        brain tumor stroke like sx's   Diabetes Father    Cancer Brother    Breast cancer Maternal Aunt    Ovarian cancer Maternal Aunt    Aneurysm Maternal Grandmother    Cancer Maternal Grandfather    Colon cancer Neg Hx     SOCIAL HISTORY: Social History   Socioeconomic History   Marital status: Single    Spouse name: Not on  file   Number of children: 1   Years of education: Not on file   Highest education level: Bachelor's degree (e.g., BA, AB, BS)  Occupational History   Not on file  Tobacco Use   Smoking status: Former    Pack years: 0.00    Types: Cigarettes    Quit date: 2007    Years since quitting: 15.4   Smokeless tobacco: Never  Vaping Use   Vaping Use: Never used  Substance and Sexual Activity   Alcohol use: Yes    Alcohol/week: 2.0 standard drinks    Types: 2 Glasses of wine per week    Comment: per 2 weeks    Drug use: No   Sexual activity: Yes    Birth control/protection: Condom  Other Topics Concern   Not on file  Social History Narrative   1 daughter    Education officer, 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 Wt Readings from Last 3 Encounters:  01/11/21 243 lb 9.6 oz (110.5 kg)  09/29/20 248 lb (112.5 kg)  04/10/20 227 lb (103 kg)   Patient is in no distress; well developed, nourished and groomed; neck is supple  CARDIOVASCULAR: Examination of carotid arteries is normal; no carotid bruits Regular rate and rhythm, no murmurs Examination of peripheral vascular system by observation and palpation is normal  EYES: Ophthalmoscopic exam of optic discs and posterior segments is normal; no papilledema or hemorrhages No results found.  MUSCULOSKELETAL: Gait, strength, tone, movements noted in Neurologic exam below  NEUROLOGIC: MENTAL STATUS:  No flowsheet data found. awake, alert, oriented to  person, place and time recent and remote memory intact normal attention and concentration language fluent, comprehension intact, naming intact fund of knowledge appropriate  CRANIAL NERVE:  2nd - no papilledema on fundoscopic exam 2nd, 3rd, 4th, 6th - pupils equal and reactive to light, visual fields full to confrontation, extraocular muscles intact, no nystagmus 5th - facial sensation symmetric 7th - facial strength symmetric 8th - hearing intact 9th - palate elevates symmetrically, uvula midline 11th - shoulder shrug symmetric 12th - tongue protrusion midline  MOTOR:  normal bulk and tone, full strength in the BUE, BLE  SENSORY:  normal and symmetric to light touch, temperature, vibration  COORDINATION:  finger-nose-finger, fine finger movements normal  REFLEXES:  deep tendon reflexes present and symmetric  GAIT/STATION:  narrow based gait     DIAGNOSTIC DATA (LABS, IMAGING, TESTING) - I reviewed patient records, labs, notes, testing and imaging myself where available.  Lab Results  Component Value Date   WBC 5.6 09/29/2020   HGB 13.5 09/29/2020   HCT 40.9 09/29/2020   MCV 82.7 09/29/2020   PLT 177.0 09/29/2020      Component Value Date/Time   NA 136 09/29/2020 1223   K 3.9 09/29/2020 1223   CL 101 09/29/2020 1223   CO2 28 09/29/2020 1223   GLUCOSE 87 09/29/2020 1223   BUN 9 09/29/2020 1223   CREATININE 0.85 09/29/2020 1223   CALCIUM 9.4 09/29/2020 1223   PROT 7.2 09/29/2020 1223   ALBUMIN 4.1 09/29/2020 1223   AST 32 09/29/2020 1223   ALT 34 09/29/2020 1223   ALKPHOS 57 09/29/2020 1223   BILITOT 0.4 09/29/2020 1223   Lab Results  Component Value Date   CHOL 209 (H) 09/29/2020   HDL 68.60 09/29/2020   LDLCALC 124 (H) 09/29/2020   TRIG 81.0 09/29/2020   CHOLHDL 3 09/29/2020   Lab Results  Component Value Date   HGBA1C 6.1 09/29/2020   No results found for: [id number] Lab Results  Component Value Date   TSH 2.95 09/29/2020     10/14/20  MRI brain / MRA head [I reviewed images myself and agree with interpretation. -VRP]  1. 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 2. No major intracranial arterial occlusion or aneurysm. 3. Evidence of intracranial atherosclerosis including a moderate proximal right M2 stenosis.    ASSESSMENT AND PLAN  55 y.o. year old female here with:  Dx:  1. Migraine with aura and without status migrainosus, not intractable      PLAN:   MIGRAINE PREVENTION  LIFESTYLE CHANGES -Stop or avoid smoking -Decrease or avoid caffeine / alcohol -Eat and sleep on a regular schedule -Exercise several times per week - start topiramate 50mg  at bedtime; after 1-2 weeks increase to 50mg  twice a day; drink plenty of water - stop amitriptyline    MIGRAINE RESCUE  - ibuprofen, tylenol as needed - rizatriptan (Maxalt) 10mg  as needed for breakthrough headache; may repeat x 1 after 2 hours; max 2 tabs per day or 8 per month   SMALL INTRACRANIAL LIPOMA - incidental finding; not related to headaches  INTRACRANIAL ATHEROSCLEROSIS (asymptomatic) - continue medical mgmt (statin, BP control)   Meds ordered this encounter  Medications   topiramate (TOPAMAX) 50 MG tablet    Sig: Take 1 tablet (50 mg total) by mouth 2 (two) times daily.    Dispense:  60 tablet    Refill:  12   rizatriptan (MAXALT-MLT) 10 MG disintegrating tablet    Sig: Take 1 tablet (10 mg total) by mouth as needed for migraine. May repeat in 2 hours if needed    Dispense:  9 tablet    Refill:  11    Return in about 4 months (around 05/13/2021) for with NP (Amy Lomax).    Penni Bombard, MD 0/16/0109, 3:23 AM Certified in Neurology, Neurophysiology and Neuroimaging  [facility] [address], [place], [place] [address] [phone]

## 2021-01-11 NOTE — Patient Instructions (Signed)
  MIGRAINE PREVENTION  LIFESTYLE CHANGES -Stop or avoid smoking -Decrease or avoid caffeine / alcohol -Eat and sleep on a regular schedule -Exercise several times per week - start topiramate 50mg  at bedtime; after 1-2 weeks increase to 50mg  twice a day; drink plenty of water - stop amitriptyline    MIGRAINE RESCUE  - ibuprofen, tylenol as needed - rizatriptan (Maxalt) 10mg  as needed for breakthrough headache; may repeat x 1 after 2 hours; max 2 tabs per day or 8 per month

## 2021-01-11 NOTE — Telephone Encounter (Signed)
Patient was no show for new patient appointment today. 

## 2021-01-13 ENCOUNTER — Ambulatory Visit: Payer: BC Managed Care – PPO | Admitting: Internal Medicine

## 2021-01-26 ENCOUNTER — Other Ambulatory Visit: Payer: Self-pay

## 2021-01-26 ENCOUNTER — Ambulatory Visit: Payer: BC Managed Care – PPO | Admitting: Internal Medicine

## 2021-01-26 ENCOUNTER — Encounter: Payer: Self-pay | Admitting: Internal Medicine

## 2021-01-26 ENCOUNTER — Other Ambulatory Visit (HOSPITAL_COMMUNITY)
Admission: RE | Admit: 2021-01-26 | Discharge: 2021-01-26 | Disposition: A | Discharge status: Home / Self Care | Payer: BC Managed Care – PPO | Source: Ambulatory Visit | Attending: Internal Medicine | Admitting: Internal Medicine

## 2021-01-26 VITALS — BP 124/80 | HR 64 | Temp 97.7°F | Ht 68.0 in | Wt 240.4 lb

## 2021-01-26 DIAGNOSIS — I10 Essential (primary) hypertension: Secondary | ICD-10-CM | POA: Diagnosis not present

## 2021-01-26 DIAGNOSIS — E559 Vitamin D deficiency, unspecified: Secondary | ICD-10-CM

## 2021-01-26 DIAGNOSIS — G43909 Migraine, unspecified, not intractable, without status migrainosus: Secondary | ICD-10-CM

## 2021-01-26 DIAGNOSIS — Z124 Encounter for screening for malignant neoplasm of cervix: Secondary | ICD-10-CM

## 2021-01-26 DIAGNOSIS — R7303 Prediabetes: Secondary | ICD-10-CM | POA: Diagnosis not present

## 2021-01-26 DIAGNOSIS — D72820 Lymphocytosis (symptomatic): Secondary | ICD-10-CM

## 2021-01-26 DIAGNOSIS — Z0000 Encounter for general adult medical examination without abnormal findings: Secondary | ICD-10-CM

## 2021-01-26 DIAGNOSIS — E669 Obesity, unspecified: Secondary | ICD-10-CM

## 2021-01-26 NOTE — Progress Notes (Signed)
Chief Complaint  Patient presents with   Gynecologic Exam   Annual  1. Htn controlled on lis hct 10-12.5 mg qd hld not taking lipitor 10 mg qhs trying healthy diet and exercise  2. Migraines ok on maxalt prn not taking topamax  3. Obesity stopped red meat and pork as of 04/2021 will be 1 year   Review of Systems  Constitutional:  Negative for weight loss.  HENT:  Negative for hearing loss.   Eyes:  Negative for blurred vision.  Respiratory:  Negative for shortness of breath.   Cardiovascular:  Negative for chest pain.  Gastrointestinal:  Negative for abdominal pain.  Musculoskeletal:  Negative for falls and joint pain.  Skin:  Negative for rash.  Neurological:  Negative for headaches.  Psychiatric/Behavioral:  Negative for depression.   Past Medical History:  Diagnosis Date   Fibroids    Genital herpes    Genital warts    Hyperlipidemia    Hypertension    Migraine    Past Surgical History:  Procedure Laterality Date   COLONOSCOPY WITH PROPOFOL N/A 08/28/2018   Procedure: COLONOSCOPY WITH PROPOFOL;  Surgeon: Lin Landsman, MD;  Location: [facility] ENDOSCOPY;  Service: Gastroenterology;  Laterality: N/A;   ECTOPIC PREGNANCY SURGERY Left    EMBOLIZATION     For fibroid   ESOPHAGOGASTRODUODENOSCOPY (EGD) WITH PROPOFOL N/A 08/28/2018   Procedure: ESOPHAGOGASTRODUODENOSCOPY (EGD) WITH PROPOFOL;  Surgeon: Lin Landsman, MD;  Location: [place];  Service: Gastroenterology;  Laterality: N/A;   Family History  Problem Relation Age of Onset   Arthritis Mother    Diabetes Mother    Other Mother        brain tumor stroke like sx's   Diabetes Father    Cancer Brother    Breast cancer Maternal Aunt    Ovarian cancer Maternal Aunt    Aneurysm Maternal Grandmother    Cancer Maternal Grandfather    Colon cancer Neg Hx    Social History   Socioeconomic History   Marital status: Single    Spouse name: Not on file   Number of children: 1   Years of education: Not on  file   Highest education level: Bachelor's degree (e.g., BA, AB, BS)  Occupational History   Not on file  Tobacco Use   Smoking status: Former    Pack years: 0.00    Types: Cigarettes    Quit date: 2007    Years since quitting: 15.5   Smokeless tobacco: Never  Vaping Use   Vaping Use: Never used  Substance and Sexual Activity   Alcohol use: Yes    Alcohol/week: 2.0 standard drinks    Types: 2 Glasses of wine per week    Comment: per 2 weeks    Drug use: No   Sexual activity: Yes    Birth control/protection: Condom  Other Topics Concern   Not on file  Social History Narrative   1 daughter    Education officer, 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   hydrocortisone 2.5 % ointment Apply topically 2 (two)  times daily. Prn ears   lisinopril-hydrochlorothiazide (ZESTORETIC) 10-12.5 MG tablet Take 1 tablet by mouth daily. In am   loratadine (CLARITIN) 10 MG tablet Take 1 tablet (10 mg total) by mouth daily as needed for allergies.   methocarbamol (ROBAXIN) 500 MG tablet Take 1 tablet (500 mg total) by mouth at bedtime as needed for muscle spasms.   pantoprazole (PROTONIX) 40 MG tablet Take 1 tablet (40 mg total) by mouth daily. 30 min before a meal   rizatriptan (MAXALT-MLT) 10 MG disintegrating tablet Take 1 tablet (10 mg total) by mouth as needed for migraine. May repeat in 2 hours if needed   sodium chloride (OCEAN) 0.65 % SOLN nasal spray Place 2 sprays into both nostrils as needed for congestion.   valACYclovir (VALTREX) 500 MG tablet Take 1 tablet (500 mg total) by mouth daily. For  prevention bid x 3-7 days outbreak   VITAMIN D PO Take by mouth.   Allergies  Allergen Reactions   Strawberry (Diagnostic) Hives, Itching and Swelling   No results found for this or any previous visit (from the past 2160 hour(s)). Objective  Body mass index is 36.55 kg/m. Wt Readings from Last 3 Encounters:  01/26/21 240 lb 6.4 oz (109 kg)  01/11/21 243 lb 9.6 oz (110.5 kg)  09/29/20 248 lb (112.5 kg)   Temp Readings from Last 3 Encounters:  01/26/21 97.7 F (36.5 C) (Oral)  09/29/20 98.4 F (36.9 C) (Oral)  08/28/18 97.6 F (36.4 C) (Tympanic)   BP Readings from Last 3 Encounters:  01/26/21 124/80  01/11/21 122/81  09/29/20 120/80   Pulse Readings from Last 3 Encounters:  01/26/21 64  01/11/21 69  09/29/20 73    Physical Exam Vitals and nursing note reviewed.  Constitutional:      Appearance: Normal appearance. She is well-developed and well-groomed. She is obese.  HENT:     Head: Normocephalic and atraumatic.  Eyes:     Conjunctiva/sclera: Conjunctivae normal.     Pupils: Pupils are equal, round, and reactive to light.  Cardiovascular:     Rate and Rhythm: Normal rate and regular rhythm.     Heart sounds: Normal heart sounds. No murmur heard. Pulmonary:     Effort: Pulmonary effort is normal.     Breath sounds: Normal breath sounds.  Chest:     Chest wall: No mass.  Breasts:    Breasts are symmetrical.     Right: Normal. No axillary adenopathy.     Left: Normal. No axillary adenopathy.  Abdominal:     General: Abdomen is flat. Bowel sounds are normal.  Genitourinary:    Exam position: Supine.     Pubic Area: No rash.      Labia:        Right: No rash.        Left: No rash.      Vagina: Normal.     Uterus: Absent.      Adnexa: Right adnexa normal and left adnexa normal.     Comments: +cervical stenosis  Lymphadenopathy:     Upper Body:     Right upper body: No axillary adenopathy.     Left upper body: No axillary adenopathy.  Skin:     General: Skin is warm and dry.  Neurological:     General: No focal deficit present.     Mental Status: She is alert and oriented to person, place, and time. Mental status is at baseline.     Gait: Gait normal.  Psychiatric:        Attention and Perception: Attention and perception normal.        Mood and Affect: Mood and affect normal.        Speech: Speech normal.        Behavior: Behavior normal. Behavior is cooperative.        Thought Content: Thought content normal.        Cognition and Memory: Cognition and memory normal.        Judgment: Judgment normal.    Assessment  Plan  Annual physical exam Flu shot 06/02/20 Tdap had  Hep B 3/3 immune Pfizer 3/3 consider booster  rec shingrix as well 06/02/20, 09/29/20   Pap neg 07/05/17 neg hpv Dr. Enzo Bi -today with + cervical stenosis     mammo neg 01/29/18  -->new order in pt to call to schedule   Colonoscopy 08/28/18 normal    sch fasting labs 10/22 goal wt <200 lbs as of 01/26/21    Hypertension, unspecified type - Plan: Comprehensive metabolic panel, Lipid panel, CBC with Differential/Platelet Controlled on lis hctz 10-12.5 mg qd   Prediabetes - Plan: Hemoglobin A1c  Obesity (BMI 30-39.9)  Migraine without status migrainosus, not intractable, unspecified migraine type On maxalt prn  F/u neurology   Vitamin D deficiency - Plan: Vitamin D (25 hydroxy)  Provider: Dr. Olivia Mackie McLean-Scocuzza-Internal Medicine

## 2021-01-26 NOTE — Patient Instructions (Addendum)
Call and schedule mammogram   Think about 4th covid shot

## 2021-02-02 LAB — CYTOLOGY - PAP
Comment: NEGATIVE
Diagnosis: NEGATIVE
High risk HPV: NEGATIVE

## 2021-02-09 ENCOUNTER — Ambulatory Visit (INDEPENDENT_AMBULATORY_CARE_PROVIDER_SITE_OTHER): Payer: BC Managed Care – PPO

## 2021-02-09 ENCOUNTER — Ambulatory Visit
Admission: RE | Admit: 2021-02-09 | Discharge: 2021-02-09 | Disposition: A | Discharge status: Home / Self Care | Payer: BC Managed Care – PPO | Source: Ambulatory Visit | Attending: Internal Medicine | Admitting: Internal Medicine

## 2021-02-09 ENCOUNTER — Ambulatory Visit: Payer: BC Managed Care – PPO | Admitting: Podiatry

## 2021-02-09 ENCOUNTER — Other Ambulatory Visit: Payer: Self-pay

## 2021-02-09 DIAGNOSIS — Z1231 Encounter for screening mammogram for malignant neoplasm of breast: Secondary | ICD-10-CM | POA: Insufficient documentation

## 2021-02-09 DIAGNOSIS — M6788 Other specified disorders of synovium and tendon, other site: Secondary | ICD-10-CM

## 2021-02-09 DIAGNOSIS — S9002XA Contusion of left ankle, initial encounter: Secondary | ICD-10-CM | POA: Diagnosis not present

## 2021-02-09 NOTE — Progress Notes (Signed)
   HPI: 55 y.o. female presenting today as a new patient for concern regarding a lump to the posterior ankle of the left lower extremity.  She states that has been present for approximately 1-2 years.  It began when she was in [place] [employer] [place] during physical exercise she noticed some pain to the posterior ankle and enlargement of the Achilles tendon.  She presents for further treatment and evaluation.  She states over the past year the pain has significantly improved and she hardly notices it anymore but she would like to have it evaluated  Past Medical History:  Diagnosis Date   Fibroids    Genital herpes    Genital warts    Hyperlipidemia    Hypertension    Migraine      Physical Exam: General: The patient is alert and oriented x3 in no acute distress.  Dermatology: Skin is warm, dry and supple bilateral lower extremities. Negative for open lesions or macerations.  Vascular: Palpable pedal pulses bilaterally. No edema or erythema noted. Capillary refill within normal limits.  Neurological: Epicritic and protective threshold grossly intact bilaterally.   Musculoskeletal Exam: Range of motion within normal limits to all pedal and ankle joints bilateral. Muscle strength 5/5 in all groups bilateral.  There is a palpable nodule and enlargement of the Achilles tendon approximately 4-6 cm proximal to the insertion on the posterior calcaneal tubercle.  Asymptomatic with palpation.  Radiographic Exam:  Normal osseous mineralization. Joint spaces preserved. No fracture/dislocation/boney destruction.    Assessment: 1.  Focal area of Achilles tendinosis with enlargement about 4-6 cm proximal to the insertion of the posterior calcaneal tubercle   Plan of Care:  1. Patient evaluated. X-Rays reviewed.  2.  Explained to the patient that the "lump" along the mid substance of the Achilles tendon is likely due to history of injury causing possible intrasubstance microtears which created swelling.   She states that over the last 1-2 years there has been improvement and is minimally symptomatic at the moment. 3.  Recommend daily stretching exercises 4.  Recommend good supportive shoes and sneakers.  Continue full activity and walking daily 5.  Return to clinic as needed       Edrick Kins, DPM [facility]  Dr. Edrick Kins, DPM    [address], [place] [address]                Office [phone]  Fax [phone]

## 2021-03-02 ENCOUNTER — Telehealth: Payer: Self-pay | Admitting: Internal Medicine

## 2021-03-02 NOTE — Telephone Encounter (Signed)
"  Rejection Reason - Patient was No Show" Docia Barrier said on Mar 02, 2021 9:48 AM  Pt appt was on 03/02/2021 at 9 am.  Msg from [facility]

## 2021-03-09 ENCOUNTER — Telehealth: Payer: Self-pay | Admitting: Internal Medicine

## 2021-03-09 ENCOUNTER — Telehealth (INDEPENDENT_AMBULATORY_CARE_PROVIDER_SITE_OTHER): Payer: BC Managed Care – PPO | Admitting: Registered Nurse

## 2021-03-09 ENCOUNTER — Other Ambulatory Visit: Payer: Self-pay

## 2021-03-09 VITALS — Temp 97.7°F

## 2021-03-09 DIAGNOSIS — U071 COVID-19: Secondary | ICD-10-CM

## 2021-03-09 MED ORDER — DM-GUAIFENESIN ER 30-600 MG PO TB12: 1.0000 | ORAL_TABLET | Freq: Two times a day (BID) | ORAL | 0 refills | Status: DC

## 2021-03-09 MED ORDER — NIRMATRELVIR/RITONAVIR (PAXLOVID)TABLET: 3.0000 | ORAL_TABLET | Freq: Two times a day (BID) | ORAL | 0 refills | Status: AC

## 2021-03-09 NOTE — Telephone Encounter (Signed)
Patient informed, Due to the high volume of calls and your symptoms we have to forward your call to our Triage Nurse to expedient your call. Please hold for the transfer.  Patient transferred to [facility] at Access Nurse. Due to having a scratchy throat,sinus issues and testing positive for COVID today.No openings in office or virtual.

## 2021-03-09 NOTE — Telephone Encounter (Signed)
Patient had an appointment at another office today.

## 2021-03-09 NOTE — Progress Notes (Signed)
Telemedicine Encounter- SOAP NOTE Established Patient  This video encounter was conducted with the patient's (or proxy's) verbal consent via audio telecommunications: yes/no: Yes Patient was instructed to have this encounter in a suitably private space; and to only have persons present to whom they give permission to participate. In addition, patient identity was confirmed by use of name plus two identifiers (DOB and address).  I discussed the limitations, risks, security and privacy concerns of performing an evaluation and management service by telephone and the availability of in person appointments. I also discussed with the patient that there may be a patient responsible charge related to this service. The patient expressed understanding and agreed to proceed.  I spent a total of 18 minutes talking with the patient or their proxy.  Patient at home Provider in office  Participants: Kathrin Ruddy, NP and Glyn Ade  Chief Complaint  Patient presents with   Covid Positive    Pt reports Saturday started scratchy throat leaving work, COVID test Sunday neg, developed cough positive today. Pt reports trying to get treatment.,     Subjective   Nichole Rodriguez is a 55 y.o. established patient. Video visit today for COVID  HPI Scratchy throat onset Saturday Then into intermittent dry cough, sinus pressure, itching eyes, runny nose, rhinitis, mild fatigue Negative covid test on Sunday, positive today.  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   Vitamin D deficiency 06/17/2019   Prediabetes 02/28/2019   HSV infection 02/28/2019   Nonintractable headache 02/28/2019   Duodenal ulcer    Gastroesophageal reflux disease without esophagitis    Dyspepsia    Colon cancer screening    Obesity (BMI  30-39.9) 06/28/2018   Multiple food allergies 06/28/2018   Allergic rhinitis 06/28/2018   Hair loss 06/28/2018   Fatigue 10/20/2017   Insomnia 10/20/2017   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 20 tablet 0   fluticasone (FLONASE) 50 MCG/ACT nasal spray Place 1-2 sprays into both nostrils daily. 16 g 12   hydrocortisone 2.5 % ointment Apply topically 2 (two) times daily. Prn ears 60 g 11   lisinopril-hydrochlorothiazide (ZESTORETIC) 10-12.5 MG tablet Take 1 tablet by mouth daily. In am 90 tablet 3   loratadine (CLARITIN) 10 MG tablet Take 1 tablet (10 mg total) by mouth daily as needed for allergies. 90 tablet 3   methocarbamol (ROBAXIN) 500 MG tablet Take 1 tablet (500 mg total) by mouth at bedtime as needed for muscle spasms. 90 tablet 3   pantoprazole (PROTONIX) 40 MG tablet Take 1 tablet (40 mg total) by mouth daily. 30 min before a meal 90 tablet 3   rizatriptan (MAXALT-MLT) 10 MG disintegrating tablet Take 1 tablet (10 mg total) by mouth as needed for migraine. May repeat  in 2 hours if needed 9 tablet 11   sodium chloride (OCEAN) 0.65 % SOLN nasal spray Place 2 sprays into both nostrils as needed for congestion. 30 mL 12   valACYclovir (VALTREX) 500 MG tablet Take 1 tablet (500 mg total) by mouth daily. For prevention bid x 3-7 days outbreak 90 tablet 3   VITAMIN D PO Take by mouth.     atorvastatin (LIPITOR) 10 MG tablet Take 1 tablet (10 mg total) by mouth daily at 6 PM. (Patient not taking: No sig  reported) 90 tablet 3   No current facility-administered medications for this visit.    Allergies  Allergen Reactions   Strawberry (Diagnostic) Hives, Itching and Swelling    Social History   Socioeconomic History   Marital status: Single    Spouse name: Not on file   Number of children: 1   Years of education: Not on file   Highest education level: Bachelor's degree (e.g., BA, AB, BS)  Occupational History   Not on file  Tobacco Use   Smoking status: Former    Types: Cigarettes    Quit date: 2007    Years since quitting: 15.7   Smokeless tobacco: Never  Vaping Use   Vaping Use: Never used  Substance and Sexual Activity   Alcohol use: Yes    Alcohol/week: 2.0 standard drinks    Types: 2 Glasses of wine per week    Comment: per 2 weeks    Drug use: No   Sexual activity: Yes    Birth control/protection: Condom  Other Topics Concern   Not on file  Social History Narrative   1 daughter    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   HENT:  Positive for congestion, sinus pain and sore throat. Negative for ear discharge, ear pain, hearing loss, nosebleeds and tinnitus.   Eyes: Negative.   Respiratory: Negative.  Negative for stridor.   Cardiovascular: Negative.   Gastrointestinal: Negative.   Genitourinary: Negative.   Musculoskeletal: Negative.   Skin: Negative.   Neurological: Negative.   Endo/Heme/Allergies: Negative.   Psychiatric/Behavioral: Negative.    All other systems reviewed and are negative.  Objective   Vitals as reported by the patient: Today's Vitals   03/09/21 1323  Temp: 97.7 F (36.5 C)    Katharyn  was seen today for covid positive.  Diagnoses and all orders for this visit:  COVID-19 -     nirmatrelvir/ritonavir EUA (PAXLOVID) 20 x 150 MG & 10 x '100MG'$  TABS; Take 3 tablets by mouth 2 (two) times daily for 5 days. (Take nirmatrelvir 150 mg two tablets twice daily for 5 days and ritonavir 100 mg one tablet twice daily for 5 days) Patient GFR is 78 -     dextromethorphan-guaiFENesin (MUCINEX DM) 30-600 MG 12hr tablet; Take 1 tablet by mouth 2 (two) times daily.  PLAN Discussed risks, benefits, and side effects of antivirals. Will proceed with paxlovid as above.  Mucinex for symptom relief. Discussed nonpharm supportive care. Reviewed rtc and ER precautions with patient who voices understanding Patient encouraged to call clinic with any questions, comments, or concerns.  I discussed the assessment and treatment plan with the patient. The patient was provided an opportunity to ask questions and all were answered. The patient agreed with the plan and demonstrated an understanding of the instructions.   The patient was advised to call back or seek an in-person evaluation if the symptoms worsen or if the condition fails to improve as anticipated.  I provided 18 minutes of non-face-to-face time during this encounter.  Maximiano Coss, NP

## 2021-03-10 NOTE — Telephone Encounter (Signed)
Per review, it appears rx sent in for paxlovid - see note - richard morrow.

## 2021-05-12 ENCOUNTER — Other Ambulatory Visit: Payer: BC Managed Care – PPO

## 2021-05-12 NOTE — Progress Notes (Deleted)
No chief complaint on file.    HISTORY OF PRESENT ILLNESS:  05/12/21 ALL:  Nichole Rodriguez is a 55 y.o. female here today for follow up for migraines. She was switched from amitriptyline to topiramate 50mg  BID at consult with Dr Leta Baptist 12/2020. Since,  Rizatriptan    HISTORY (copied from previous note)  55 year old female here for evaluation of headaches.   Patient started having headaches in her 23s with left-sided throbbing headaches, photophobia, lightheadedness, seeing spots and sparkles.  Headache can last hours at a time.  She has tried Excedrin PM over-the-counter with mild relief.  She was started on amitriptyline a few months ago without significant benefit.  Family history of migraine in her brother and daughter.  Triggering factors include stress, computer use and lack of sleep.  She averages about 4 to 5 hours of sleep at night.  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  Patient used to live in [place] and moved to [place] around 2017.   REVIEW OF SYSTEMS: Out of a complete 14 system review of symptoms, the patient complains only of the following symptoms, and all other reviewed systems are negative.   ALLERGIES: Allergies  Allergen Reactions   Strawberry (Diagnostic) Hives, Itching and Swelling     HOME MEDICATIONS: Outpatient Medications Prior to Visit  Medication Sig Dispense Refill   atorvastatin (LIPITOR) 10 MG tablet Take 1 tablet (10 mg total) by mouth daily at 6 PM. (Patient not taking: No sig reported) 90 tablet 3   dextromethorphan-guaiFENesin (MUCINEX DM) 30-600 MG 12hr tablet Take 1 tablet by mouth 2 (two) times daily. 20 tablet 0   fluticasone (FLONASE) 50 MCG/ACT nasal spray Place 1-2 sprays into both nostrils daily. 16 g 12   hydrocortisone 2.5 % ointment Apply topically 2 (two) times daily. Prn ears 60 g 11   lisinopril-hydrochlorothiazide (ZESTORETIC) 10-12.5 MG tablet Take 1 tablet by mouth daily. In am 90 tablet 3    loratadine (CLARITIN) 10 MG tablet Take 1 tablet (10 mg total) by mouth daily as needed for allergies. 90 tablet 3   methocarbamol (ROBAXIN) 500 MG tablet Take 1 tablet (500 mg total) by mouth at bedtime as needed for muscle spasms. 90 tablet 3   pantoprazole (PROTONIX) 40 MG tablet Take 1 tablet (40 mg total) by mouth daily. 30 min before a meal 90 tablet 3   rizatriptan (MAXALT-MLT) 10 MG disintegrating tablet Take 1 tablet (10 mg total) by mouth as needed for migraine. May repeat in 2 hours if needed 9 tablet 11   sodium chloride (OCEAN) 0.65 % SOLN nasal spray Place 2 sprays into both nostrils as needed for congestion. 30 mL 12   valACYclovir (VALTREX) 500 MG tablet Take 1 tablet (500 mg total) by mouth daily. For prevention bid x 3-7 days outbreak 90 tablet 3   VITAMIN D PO Take by mouth.     No facility-administered medications prior to visit.     PAST MEDICAL HISTORY: Past Medical History:  Diagnosis Date   Fibroids    Genital herpes    Genital warts    Hyperlipidemia    Hypertension    Migraine      PAST SURGICAL HISTORY: Past Surgical History:  Procedure Laterality Date   COLONOSCOPY WITH PROPOFOL N/A 08/28/2018   Procedure: COLONOSCOPY WITH PROPOFOL;  Surgeon: Lin Landsman, MD;  Location: [facility];  Service: Gastroenterology;  Laterality: N/A;   ECTOPIC PREGNANCY SURGERY Left    EMBOLIZATION  For fibroid   ESOPHAGOGASTRODUODENOSCOPY (EGD) WITH PROPOFOL N/A 08/28/2018   Procedure: ESOPHAGOGASTRODUODENOSCOPY (EGD) WITH PROPOFOL;  Surgeon: Lin Landsman, MD;  Location: [place];  Service: Gastroenterology;  Laterality: N/A;     FAMILY HISTORY: Family History  Problem Relation Age of Onset   Arthritis Mother    Diabetes Mother    Other Mother        brain tumor stroke like sx's   Diabetes Father    Cancer Brother    Breast cancer Maternal Aunt    Ovarian cancer Maternal Aunt    Aneurysm Maternal Grandmother    Cancer Maternal  Grandfather    Colon cancer Neg Hx      SOCIAL HISTORY: Social History   Socioeconomic History   Marital status: Single    Spouse name: Not on file   Number of children: 1   Years of education: Not on file   Highest education level: Bachelor's degree (e.g., BA, AB, BS)  Occupational History   Not on file  Tobacco Use   Smoking status: Former    Types: Cigarettes    Quit date: 2007    Years since quitting: 15.8   Smokeless tobacco: Never  Vaping Use   Vaping Use: Never used  Substance and Sexual Activity   Alcohol use: Yes    Alcohol/week: 2.0 standard drinks    Types: 2 Glasses of wine per week    Comment: per 2 weeks    Drug use: No   Sexual activity: Yes    Birth control/protection: Condom  Other Topics Concern   Not on file  Social History Narrative   1 daughter    Education officer, 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 There is no height or weight on file to calculate BMI.  Generalized: Well developed, in no acute distress  Cardiology: normal rate and rhythm, no murmur auscultated  Respiratory: clear to auscultation bilaterally    Neurological examination  Mentation: Alert oriented to time, place, history taking. Follows all commands speech and language fluent Cranial nerve II-XII: Pupils were equal round reactive to light. Extraocular movements were full, visual field were full on confrontational test. Facial sensation and strength were normal. Uvula tongue midline. Head turning and shoulder shrug  were normal and symmetric. Motor: The motor testing reveals 5 over 5 strength of all 4 extremities. Good symmetric motor  tone is noted throughout.  Sensory: Sensory testing is intact to soft touch on all 4 extremities. No evidence of extinction is noted.  Coordination: Cerebellar testing reveals good finger-nose-finger and heel-to-shin bilaterally.  Gait and station: Gait is normal. Tandem gait is normal. Romberg is negative. No drift is seen.  Reflexes: Deep tendon reflexes are symmetric and normal bilaterally.    DIAGNOSTIC DATA (LABS, IMAGING, TESTING) - I reviewed patient records, labs, notes, testing and imaging myself where available.  Lab Results  Component Value Date   WBC 5.6 09/29/2020   HGB 13.5 09/29/2020   HCT 40.9 09/29/2020   MCV 82.7 09/29/2020   PLT 177.0 09/29/2020      Component Value Date/Time   NA 136  09/29/2020 1223   K 3.9 09/29/2020 1223   CL 101 09/29/2020 1223   CO2 28 09/29/2020 1223   GLUCOSE 87 09/29/2020 1223   BUN 9 09/29/2020 1223   CREATININE 0.85 09/29/2020 1223   CALCIUM 9.4 09/29/2020 1223   PROT 7.2 09/29/2020 1223   ALBUMIN 4.1 09/29/2020 1223   AST 32 09/29/2020 1223   ALT 34 09/29/2020 1223   ALKPHOS 57 09/29/2020 1223   BILITOT 0.4 09/29/2020 1223   Lab Results  Component Value Date   CHOL 209 (H) 09/29/2020   HDL 68.60 09/29/2020   LDLCALC 124 (H) 09/29/2020   TRIG 81.0 09/29/2020   CHOLHDL 3 09/29/2020   Lab Results  Component Value Date   HGBA1C 6.1 09/29/2020   No results found for: [id number] Lab Results  Component Value Date   TSH 2.95 09/29/2020    No flowsheet data found.   No flowsheet data found.   ASSESSMENT AND PLAN  55 y.o. year old female  has a past medical history of Fibroids, Genital herpes, Genital warts, Hyperlipidemia, Hypertension, and Migraine. here with    No diagnosis found.   No orders of the defined types were placed in this encounter.    No orders of the defined types were placed in this encounter.     Debbora Presto, MSN, FNP-C 05/12/2021, 3:12 PM  [facility] [address]  [place], [place] [address] [phone]

## 2021-05-12 NOTE — Patient Instructions (Incomplete)

## 2021-05-17 ENCOUNTER — Encounter: Payer: Self-pay | Admitting: Family Medicine

## 2021-05-17 ENCOUNTER — Ambulatory Visit: Payer: BC Managed Care – PPO | Admitting: Family Medicine

## 2021-05-19 ENCOUNTER — Ambulatory Visit: Payer: BC Managed Care – PPO | Admitting: Internal Medicine

## 2021-05-19 ENCOUNTER — Telehealth: Payer: Self-pay | Admitting: Internal Medicine

## 2021-05-19 NOTE — Telephone Encounter (Signed)
Patient no-showed today's appointment; appointment was for 11/2, provider notified for review of record. Letter sent for patient to call in and re-schedule.

## 2021-07-29 ENCOUNTER — Other Ambulatory Visit: Payer: BC Managed Care – PPO

## 2021-07-30 ENCOUNTER — Other Ambulatory Visit: Payer: Self-pay

## 2021-07-30 ENCOUNTER — Other Ambulatory Visit (INDEPENDENT_AMBULATORY_CARE_PROVIDER_SITE_OTHER): Payer: BC Managed Care – PPO

## 2021-07-30 DIAGNOSIS — I10 Essential (primary) hypertension: Secondary | ICD-10-CM | POA: Diagnosis not present

## 2021-07-30 DIAGNOSIS — R7303 Prediabetes: Secondary | ICD-10-CM

## 2021-07-30 DIAGNOSIS — E559 Vitamin D deficiency, unspecified: Secondary | ICD-10-CM | POA: Diagnosis not present

## 2021-07-30 LAB — COMPREHENSIVE METABOLIC PANEL
ALT: 34 U/L (ref 0–35)
AST: 31 U/L (ref 0–37)
Albumin: 4.5 g/dL (ref 3.5–5.2)
Alkaline Phosphatase: 60 U/L (ref 39–117)
BUN: 13 mg/dL (ref 6–23)
CO2: 27 mEq/L (ref 19–32)
Calcium: 9.6 mg/dL (ref 8.4–10.5)
Chloride: 104 mEq/L (ref 96–112)
Creatinine, Ser: 1.09 mg/dL (ref 0.40–1.20)
GFR: 58.15 mL/min — ABNORMAL LOW (ref >60.00)
Glucose, Bld: 91 mg/dL (ref 70–99)
Potassium: 3.7 mEq/L (ref 3.5–5.1)
Sodium: 139 mEq/L (ref 135–145)
Total Bilirubin: 0.5 mg/dL (ref 0.2–1.2)
Total Protein: 7.1 g/dL (ref 6.0–8.3)

## 2021-07-30 LAB — LIPID PANEL
Cholesterol: 225 mg/dL — ABNORMAL HIGH (ref 0–200)
HDL: 69.4 mg/dL (ref >39.00)
LDL Cholesterol: 140 mg/dL — ABNORMAL HIGH (ref 0–99)
NonHDL: 155.75
Total CHOL/HDL Ratio: 3
Triglycerides: 80 mg/dL (ref 0.0–149.0)
VLDL: 16 mg/dL (ref 0.0–40.0)

## 2021-07-30 LAB — CBC WITH DIFFERENTIAL/PLATELET
Basophils Absolute: 0 10*3/uL (ref 0.0–0.1)
Basophils Relative: 1 % (ref 0.0–3.0)
Eosinophils Absolute: 0.1 10*3/uL (ref 0.0–0.7)
Eosinophils Relative: 1.2 % (ref 0.0–5.0)
HCT: 41.9 % (ref 36.0–46.0)
Hemoglobin: 13.5 g/dL (ref 12.0–15.0)
Lymphocytes Relative: 54.7 % — ABNORMAL HIGH (ref 12.0–46.0)
Lymphs Abs: 2.7 10*3/uL (ref 0.7–4.0)
MCHC: 32.1 g/dL (ref 30.0–36.0)
MCV: 82.3 fl (ref 78.0–100.0)
Monocytes Absolute: 0.4 10*3/uL (ref 0.1–1.0)
Monocytes Relative: 8 % (ref 3.0–12.0)
Neutro Abs: 1.7 10*3/uL (ref 1.4–7.7)
Neutrophils Relative %: 35.1 % — ABNORMAL LOW (ref 43.0–77.0)
Platelets: 189 10*3/uL (ref 150.0–400.0)
RBC: 5.09 Mil/uL (ref 3.87–5.11)
RDW: 13.9 % (ref 11.5–15.5)
WBC: 4.9 10*3/uL (ref 4.0–10.5)

## 2021-07-30 LAB — HEMOGLOBIN A1C: Hgb A1c MFr Bld: 6.3 % (ref 4.6–6.5)

## 2021-07-30 LAB — VITAMIN D 25 HYDROXY (VIT D DEFICIENCY, FRACTURES): VITD: 34.91 ng/mL (ref 30.00–100.00)

## 2021-08-02 ENCOUNTER — Other Ambulatory Visit: Payer: Self-pay | Admitting: Internal Medicine

## 2021-08-02 ENCOUNTER — Other Ambulatory Visit: Payer: Self-pay

## 2021-08-02 ENCOUNTER — Encounter: Payer: Self-pay | Admitting: Internal Medicine

## 2021-08-02 DIAGNOSIS — D72820 Lymphocytosis (symptomatic): Secondary | ICD-10-CM

## 2021-08-02 NOTE — Addendum Note (Signed)
Addended by: Orland Mustard on: 08/02/2021 03:08 PM   Modules accepted: Orders

## 2021-08-04 ENCOUNTER — Other Ambulatory Visit (HOSPITAL_COMMUNITY)
Admission: RE | Admit: 2021-08-04 | Discharge: 2021-08-04 | Disposition: A | Discharge status: Home / Self Care | Payer: BC Managed Care – PPO | Source: Ambulatory Visit | Attending: Internal Medicine | Admitting: Internal Medicine

## 2021-08-04 ENCOUNTER — Other Ambulatory Visit: Payer: Self-pay

## 2021-08-04 ENCOUNTER — Encounter: Payer: Self-pay | Admitting: Internal Medicine

## 2021-08-04 ENCOUNTER — Ambulatory Visit: Payer: BC Managed Care – PPO | Admitting: Internal Medicine

## 2021-08-04 VITALS — BP 110/70 | HR 56 | Temp 96.9°F | Ht 68.0 in | Wt 230.2 lb

## 2021-08-04 DIAGNOSIS — Z113 Encounter for screening for infections with a predominantly sexual mode of transmission: Secondary | ICD-10-CM | POA: Insufficient documentation

## 2021-08-04 DIAGNOSIS — R1031 Right lower quadrant pain: Secondary | ICD-10-CM

## 2021-08-04 DIAGNOSIS — R944 Abnormal results of kidney function studies: Secondary | ICD-10-CM | POA: Diagnosis not present

## 2021-08-04 DIAGNOSIS — M545 Low back pain, unspecified: Secondary | ICD-10-CM

## 2021-08-04 DIAGNOSIS — I10 Essential (primary) hypertension: Secondary | ICD-10-CM

## 2021-08-04 DIAGNOSIS — Z23 Encounter for immunization: Secondary | ICD-10-CM

## 2021-08-04 DIAGNOSIS — J309 Allergic rhinitis, unspecified: Secondary | ICD-10-CM

## 2021-08-04 DIAGNOSIS — R102 Pelvic and perineal pain unspecified side: Secondary | ICD-10-CM

## 2021-08-04 DIAGNOSIS — E785 Hyperlipidemia, unspecified: Secondary | ICD-10-CM

## 2021-08-04 DIAGNOSIS — B009 Herpesviral infection, unspecified: Secondary | ICD-10-CM

## 2021-08-04 DIAGNOSIS — D72820 Lymphocytosis (symptomatic): Secondary | ICD-10-CM

## 2021-08-04 DIAGNOSIS — D259 Leiomyoma of uterus, unspecified: Secondary | ICD-10-CM | POA: Diagnosis not present

## 2021-08-04 DIAGNOSIS — Z1231 Encounter for screening mammogram for malignant neoplasm of breast: Secondary | ICD-10-CM

## 2021-08-04 MED ORDER — PRAVASTATIN SODIUM 10 MG PO TABS: 10.0000 mg | ORAL_TABLET | Freq: Every day | ORAL | 3 refills | Status: DC

## 2021-08-04 MED ORDER — VALACYCLOVIR HCL 500 MG PO TABS: 500.0000 mg | ORAL_TABLET | Freq: Every day | ORAL | 3 refills | Status: DC

## 2021-08-04 MED ORDER — METHOCARBAMOL 500 MG PO TABS: 500.0000 mg | ORAL_TABLET | Freq: Every evening | ORAL | 3 refills | Status: DC | PRN

## 2021-08-04 MED ORDER — SHINGRIX 50 MCG/0.5ML IM SUSR: 0.5000 mL | Freq: Once | INTRAMUSCULAR | 1 refills | Status: AC

## 2021-08-04 MED ORDER — FLUTICASONE PROPIONATE 50 MCG/ACT NA SUSP: 1.0000 | Freq: Every day | NASAL | 12 refills | Status: DC

## 2021-08-04 NOTE — Progress Notes (Signed)
Chief Complaint  Patient presents with   Follow-up   Abdominal Pain    Onset of last week. States it feels like cramps    F/u  1. Ab pain x 1 week feels like cycle RLQ cyclical like when menses would be h/o fibroids. She thinks this is cyclical as timing is around the same time her daughter has her menses and they used to have menses around same time. Nothing tried  Offered US tvus/pelvic pt declined today and will call back last 2019 also offered ct ab/pelvis declined for today 2. New sexual partner and active using protection though condom came off  2 weeks ago wants std check  Wants refill of valtrex  3. F/u heme upcoming Dr. Janese Banks for lymphocytosis  4. Htn controlled on lis hctz 10-12.5 mg qd  Reviewed labs 07/30/21 GFR 58, HLD+, lymphocytosis +, +prediabetes  Hld stopped lipitor 10 did not like way made her muscles feel but agreeable to try lower intensity statin rx pravachol 10 mg qhs  5. Wants flu shot today disc shingrix as well given tx   Review of Systems  Constitutional:  Negative for weight loss.  HENT:  Negative for hearing loss.   Eyes:  Negative for blurred vision.  Respiratory:  Negative for shortness of breath.   Cardiovascular:  Negative for chest pain.  Gastrointestinal:  Positive for abdominal pain. Negative for blood in stool.  Genitourinary:  Negative for dysuria.  Musculoskeletal:  Negative for falls and joint pain.  Skin:  Negative for rash.  Neurological:  Negative for headaches.  Psychiatric/Behavioral:  Negative for depression.   Past Medical History:  Diagnosis Date   COVID-19    02/2021 paxlovid had   Fibroids    Genital herpes    Genital warts    Hyperlipidemia    Hypertension    Lymphocytosis    Migraine    Past Surgical History:  Procedure Laterality Date   COLONOSCOPY WITH PROPOFOL N/A 08/28/2018   Procedure: COLONOSCOPY WITH PROPOFOL;  Surgeon: Lin Landsman, MD;  Location: [facility] ENDOSCOPY;  Service: Gastroenterology;  Laterality: N/A;    ECTOPIC PREGNANCY SURGERY Left    EMBOLIZATION     For fibroid   ESOPHAGOGASTRODUODENOSCOPY (EGD) WITH PROPOFOL N/A 08/28/2018   Procedure: ESOPHAGOGASTRODUODENOSCOPY (EGD) WITH PROPOFOL;  Surgeon: Lin Landsman, MD;  Location: [place];  Service: Gastroenterology;  Laterality: N/A;   Family History  Problem Relation Age of Onset   Arthritis Mother    Diabetes Mother    Other Mother        brain tumor stroke like sx's   Diabetes Father    Cancer Brother    Breast cancer Maternal Aunt    Ovarian cancer Maternal Aunt    Aneurysm Maternal Grandmother    Cancer Maternal Grandfather    Colon cancer Neg Hx    Social History   Socioeconomic History   Marital status: Single    Spouse name: Not on file   Number of children: 1   Years of education: Not on file   Highest education level: Bachelor's degree (e.g., BA, AB, BS)  Occupational History   Not on file  Tobacco Use   Smoking status: Former    Types: Cigarettes    Quit date: 2007    Years since quitting: 16.0   Smokeless tobacco: Never  Vaping Use   Vaping Use: Never used  Substance and Sexual Activity   Alcohol use: Yes    Alcohol/week: 2.0 standard drinks  Types: 2 Glasses of wine per week    Comment: per 2 weeks    Drug use: No   Sexual activity: Yes    Birth control/protection: Condom  Other Topics Concern   Not on file  Social History Narrative   1 daughter    Education officer, 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 Prn ears   lisinopril-hydrochlorothiazide (ZESTORETIC) 10-12.5 MG tablet Take 1 tablet by mouth daily. In  am   pantoprazole (PROTONIX) 40 MG tablet Take 1 tablet (40 mg total) by mouth daily. 30 min before a meal   pravastatin (PRAVACHOL) 10 MG tablet Take 1 tablet (10 mg total) by mouth daily. After 6pm   VITAMIN D PO Take by mouth.   [EXPIRED] Zoster Vaccine Adjuvanted [facility]) injection Inject 0.5 mLs into the muscle once for 1 dose. X 2 does   [DISCONTINUED] fluticasone (FLONASE) 50 MCG/ACT nasal spray Place 1-2 sprays into both nostrils daily.   [DISCONTINUED] valACYclovir (VALTREX) 500 MG tablet Take 1 tablet (500 mg total) by mouth daily. For prevention bid x 3-7 days outbreak   Allergies  Allergen Reactions   Strawberry (Diagnostic) Hives, Itching and Swelling   Lipitor [Atorvastatin]     Muscle aches legs   Recent Results (from the past 2160 hour(s))  Vitamin D (25 hydroxy)     Status: None   Collection Time: 07/30/21 10:16 AM  Result Value Ref Range   VITD 34.91 30.00 - 100.00 ng/mL  CBC with Differential/Platelet     Status: Abnormal   Collection Time: 07/30/21 10:16 AM  Result Value Ref Range   WBC 4.9 4.0 - 10.5 K/uL   RBC 5.09 3.87 - 5.11 Mil/uL   Hemoglobin 13.5 12.0 - 15.0 g/dL   HCT 41.9 36.0 - 46.0 %   MCV 82.3 78.0 - 100.0 fl   MCHC 32.1 30.0 - 36.0 g/dL   RDW 13.9 11.5 - 15.5 %   Platelets 189.0 150.0 - 400.0 K/uL   Neutrophils Relative % 35.1 (L) 43.0 - 77.0 %   Lymphocytes Relative 54.7 (H) 12.0 - 46.0 %   Monocytes Relative 8.0 3.0 - 12.0 %   Eosinophils Relative 1.2 0.0 - 5.0 %   Basophils Relative 1.0 0.0 - 3.0 %   Neutro Abs 1.7 1.4 - 7.7 K/uL   Lymphs Abs 2.7 0.7 - 4.0 K/uL   Monocytes Absolute 0.4 0.1 - 1.0 K/uL   Eosinophils Absolute 0.1 0.0 - 0.7 K/uL   Basophils Absolute 0.0 0.0 - 0.1 K/uL  Lipid panel     Status: Abnormal   Collection Time: 07/30/21 10:16 AM  Result Value Ref Range   Cholesterol 225 (H) 0 - 200 mg/dL    Comment: ATP III Classification       Desirable:  < 200 mg/dL               Borderline High:  200 - 239 mg/dL          High:   > = 240 mg/dL   Triglycerides 80.0 0.0 - 149.0 mg/dL    Comment: Normal:  <  150 mg/dLBorderline High:  150 - 199 mg/dL   HDL 69.40 >39.00 mg/dL   VLDL 16.0 0.0 - 40.0 mg/dL   LDL Cholesterol 140 (H) 0 - 99 mg/dL   Total CHOL/HDL Ratio 3     Comment:                Men          Women1/2 Average Risk     3.4          3.3Average Risk          5.0          4.42X Average Risk          9.6          7.13X Average Risk          15.0          11.0                       NonHDL 155.75     Comment: NOTE:  Non-HDL goal should be 30 mg/dL higher than patient's LDL goal (i.e. LDL goal of < 70 mg/dL, would have non-HDL goal of < 100 mg/dL)  Comprehensive metabolic panel     Status: Abnormal   Collection Time: 07/30/21 10:16 AM  Result Value Ref Range   Sodium 139 135 - 145 mEq/L   Potassium 3.7 3.5 - 5.1 mEq/L   Chloride 104 96 - 112 mEq/L   CO2 27 19 - 32 mEq/L   Glucose, Bld 91 70 - 99 mg/dL   BUN 13 6 - 23 mg/dL   Creatinine, Ser 1.09 0.40 - 1.20 mg/dL   Total Bilirubin 0.5 0.2 - 1.2 mg/dL   Alkaline Phosphatase 60 39 - 117 U/L   AST 31 0 - 37 U/L   ALT 34 0 - 35 U/L   Total Protein 7.1 6.0 - 8.3 g/dL   Albumin 4.5 3.5 - 5.2 g/dL   GFR 58.15 (L) >60.00 mL/min    Comment: Calculated using the CKD-EPI Creatinine Equation (2021)   Calcium 9.6 8.4 - 10.5 mg/dL  Hemoglobin A1c     Status: None   Collection Time: 07/30/21 10:16 AM  Result Value Ref Range   Hgb A1c MFr Bld 6.3 4.6 - 6.5 %    Comment: Glycemic Control Guidelines for People with Diabetes:Non Diabetic:  <6%Goal of Therapy: <7%Additional Action Suggested:  >8%   Urine cytology ancillary only([place])     Status: None   Collection Time: 08/04/21  9:07 AM  Result Value Ref Range   Neisseria Gonorrhea Negative    Chlamydia Negative    Trichomonas Negative    Bacterial Vaginitis-Urine Negative    Candida Urine Negative    Molecular Comment      This specimen does not meet the strict criteria set by the FDA. The   Molecular Comment       result interpretation should be considered in conjunction with the   Molecular Comment patient's clinical history.    Comment Normal Reference Ranger Chlamydia - Negative    Comment      Normal Reference Range Neisseria Gonorrhea - Negative   Comment Normal Reference Range Trichomonas - Negative    Objective  Body mass index is 35 kg/m. Wt Readings from Last 3 Encounters:  08/04/21 230 lb 3.2 oz (104.4 kg)  01/26/21 240 lb 6.4 oz (109 kg)  01/11/21 243 lb 9.6 oz (110.5 kg)   Temp Readings  from Last 3 Encounters:  08/04/21 (!) 96.9 F (36.1 C) (Temporal)  03/09/21 97.7 F (36.5 C)  01/26/21 97.7 F (36.5 C) (Oral)   BP Readings from Last 3 Encounters:  08/04/21 110/70  01/26/21 124/80  01/11/21 122/81   Pulse Readings from Last 3 Encounters:  08/04/21 (!) 56  01/26/21 64  01/11/21 69    Physical Exam Vitals and nursing note reviewed.  Constitutional:      Appearance: Normal appearance. She is well-developed and well-groomed.  HENT:     Head: Normocephalic and atraumatic.  Eyes:     Conjunctiva/sclera: Conjunctivae normal.     Pupils: Pupils are equal, round, and reactive to light.  Cardiovascular:     Rate and Rhythm: Normal rate and regular rhythm.     Heart sounds: Normal heart sounds. No murmur heard. Pulmonary:     Effort: Pulmonary effort is normal.     Breath sounds: Normal breath sounds.  Abdominal:     General: Abdomen is flat. Bowel sounds are normal.     Tenderness: There is abdominal tenderness in the right lower quadrant.  Musculoskeletal:        General: No tenderness.  Skin:    General: Skin is warm and dry.  Neurological:     General: No focal deficit present.     Mental Status: She is alert and oriented to person, place, and time. Mental status is at baseline.     Cranial Nerves: Cranial nerves 2-12 are intact.     Motor: Motor function is intact.     Coordination: Coordination is intact.     Gait: Gait is intact.  Psychiatric:         Attention and Perception: Attention and perception normal.        Mood and Affect: Mood and affect normal.        Speech: Speech normal.        Behavior: Behavior normal. Behavior is cooperative.        Thought Content: Thought content normal.        Cognition and Memory: Cognition and memory normal.        Judgment: Judgment normal.    Assessment  Plan  Hypertension, controlled/hld  Cont lis 10 hctz 12.5 mg qd  HLD: Had AE to lipitor 10 will try pravachol 10 with consider titrate up  Consider norvasc instead of bp medication in the future pending GFR  Uterine leiomyoma, with female RLQ ab pain pelvic pain  Consider TVUS/pelvic US repeat pt declines for now now also disc ct ab/pelvis declines for now will call back   Screening for venereal disease - Plan: Urine cytology ancillary only([place]) Check other stds in 3 months hiv, hep C, hsv, rpr  HSV infection - Plan: valACYclovir (VALTREX) 500 MG tablet  Low back pain without sciatica, unspecified back pain laterality, unspecified chronicity - Plan: methocarbamol (ROBAXIN) 500 MG tablet  Allergic rhinitis, unspecified seasonality, unspecified trigger - Plan: fluticasone (FLONASE) 50 MCG/ACT nasal spray  Lymphocytosis F/u appt Dr. Darlin Drop  HM Flu shot utd given today Tdap had 12/07/16 Hep B 3/3 immune Pfizer 3/3 consider booster  rec shingrix as well 06/02/20, 09/29/20, 08/04/21    Pap neg 07/05/17 neg hpv Dr. Enzo Bi ([place])  -+ cervical stenosis  01/26/21 negative repeat in 3 years      mammo neg 02/09/21 neg ordered 01/2022   Colonoscopy 08/28/18 normal    Rec healthy diet and exercise    Provider: Dr. Olivia Mackie McLean-Scocuzza-Internal Medicine

## 2021-08-04 NOTE — Patient Instructions (Addendum)
Consider CT scan abdomen/pelvis call insurance and see preferred facility Consider US pelvis and transvaginal  Consider 4th covid dose pfizer   Zoster Vaccine, Recombinant injection What is this medication? ZOSTER VACCINE (ZOS ter vak SEEN) is a vaccine used to reduce the risk of getting shingles. This vaccine is not used to treat shingles or nerve pain from shingles. This medicine may be used for other purposes; ask your health care provider or pharmacist if you have questions. COMMON BRAND NAME(S): [facility] What should I tell my care team before I take this medication? They need to know if you have any of these conditions: cancer immune system problems an unusual or allergic reaction to Zoster vaccine, other medications, foods, dyes, or preservatives pregnant or trying to get pregnant breast-feeding How should I use this medication? This vaccine is injected into a muscle. It is given by a health care provider. A copy of Vaccine Information Statements will be given before each vaccination. Be sure to read this information carefully each time. This sheet may change often. Talk to your health care provider about the use of this vaccine in children. This vaccine is not approved for use in children. Overdosage: If you think you have taken too much of this medicine contact a poison control center or emergency room at once. NOTE: This medicine is only for you. Do not share this medicine with others. What if I miss a dose? Keep appointments for follow-up (booster) doses. It is important not to miss your dose. Call your health care provider if you are unable to keep an appointment. What may interact with this medication? medicines that suppress your immune system medicines to treat cancer steroid medicines like prednisone or cortisone This list may not describe all possible interactions. Give your health care provider a list of all the medicines, herbs, non-prescription drugs, or dietary  supplements you use. Also tell them if you smoke, drink alcohol, or use illegal drugs. Some items may interact with your medicine. What should I watch for while using this medication? Visit your health care provider regularly. This vaccine, like all vaccines, may not fully protect everyone. What side effects may I notice from receiving this medication? Side effects that you should report to your doctor or health care professional as soon as possible: allergic reactions (skin rash, itching or hives; swelling of the face, lips, or tongue) trouble breathing Side effects that usually do not require medical attention (report these to your doctor or health care professional if they continue or are bothersome): chills headache fever nausea pain, redness, or irritation at site where injected tiredness vomiting This list may not describe all possible side effects. Call your doctor for medical advice about side effects. You may report side effects to FDA at 1-800-FDA-1088. Where should I keep my medication? This vaccine is only given by a health care provider. It will not be stored at home. NOTE: This sheet is a summary. It may not cover all possible information. If you have questions about this medicine, talk to your doctor, pharmacist, or health care provider.  2022 Elsevier/Gold Standard (2021-03-23 00:00:00)

## 2021-08-05 ENCOUNTER — Encounter: Payer: Self-pay | Admitting: *Deleted

## 2021-08-05 LAB — URINE CYTOLOGY ANCILLARY ONLY
Bacterial Vaginitis-Urine: NEGATIVE
Candida Urine: NEGATIVE
Chlamydia: NEGATIVE
Comment: NEGATIVE
Comment: NEGATIVE
Comment: NORMAL
Neisseria Gonorrhea: NEGATIVE
Trichomonas: NEGATIVE

## 2021-08-11 ENCOUNTER — Inpatient Hospital Stay: Payer: BC Managed Care – PPO

## 2021-08-11 ENCOUNTER — Inpatient Hospital Stay: Payer: BC Managed Care – PPO | Admitting: Oncology

## 2021-08-16 ENCOUNTER — Telehealth: Payer: Self-pay | Admitting: Internal Medicine

## 2021-08-16 DIAGNOSIS — D72820 Lymphocytosis (symptomatic): Secondary | ICD-10-CM | POA: Insufficient documentation

## 2021-08-16 DIAGNOSIS — D259 Leiomyoma of uterus, unspecified: Secondary | ICD-10-CM | POA: Insufficient documentation

## 2021-08-16 DIAGNOSIS — E785 Hyperlipidemia, unspecified: Secondary | ICD-10-CM | POA: Insufficient documentation

## 2021-08-16 HISTORY — DX: Lymphocytosis (symptomatic): D72.820

## 2021-08-16 NOTE — Telephone Encounter (Signed)
See if pt can sch fasting labs 10/29/21 or after her visit is 11/02/21 or can do at visit?

## 2021-08-18 ENCOUNTER — Other Ambulatory Visit: Payer: Self-pay

## 2021-08-18 ENCOUNTER — Encounter: Payer: Self-pay | Admitting: Oncology

## 2021-08-18 ENCOUNTER — Inpatient Hospital Stay: Payer: BC Managed Care – PPO

## 2021-08-18 ENCOUNTER — Inpatient Hospital Stay: Payer: BC Managed Care – PPO | Attending: Oncology | Admitting: Oncology

## 2021-08-18 VITALS — BP 115/80 | HR 51 | Temp 97.7°F | Resp 16 | Ht 68.0 in | Wt 230.0 lb

## 2021-08-18 DIAGNOSIS — E785 Hyperlipidemia, unspecified: Secondary | ICD-10-CM | POA: Diagnosis not present

## 2021-08-18 DIAGNOSIS — Z87891 Personal history of nicotine dependence: Secondary | ICD-10-CM | POA: Insufficient documentation

## 2021-08-18 DIAGNOSIS — G43909 Migraine, unspecified, not intractable, without status migrainosus: Secondary | ICD-10-CM | POA: Insufficient documentation

## 2021-08-18 DIAGNOSIS — D708 Other neutropenia: Secondary | ICD-10-CM | POA: Insufficient documentation

## 2021-08-18 DIAGNOSIS — I10 Essential (primary) hypertension: Secondary | ICD-10-CM | POA: Diagnosis not present

## 2021-08-18 DIAGNOSIS — Z79899 Other long term (current) drug therapy: Secondary | ICD-10-CM | POA: Diagnosis not present

## 2021-08-18 LAB — CBC WITH DIFFERENTIAL/PLATELET
Abs Immature Granulocytes: 0.01 10*3/uL (ref 0.00–0.07)
Basophils Absolute: 0.1 10*3/uL (ref 0.0–0.1)
Basophils Relative: 1 %
Eosinophils Absolute: 0.1 10*3/uL (ref 0.0–0.5)
Eosinophils Relative: 1 %
HCT: 41.9 % (ref 36.0–46.0)
Hemoglobin: 13.5 g/dL (ref 12.0–15.0)
Immature Granulocytes: 0 %
Lymphocytes Relative: 53 %
Lymphs Abs: 2.9 10*3/uL (ref 0.7–4.0)
MCH: 26.6 pg (ref 26.0–34.0)
MCHC: 32.2 g/dL (ref 30.0–36.0)
MCV: 82.5 fL (ref 80.0–100.0)
Monocytes Absolute: 0.5 10*3/uL (ref 0.1–1.0)
Monocytes Relative: 10 %
Neutro Abs: 1.9 10*3/uL (ref 1.7–7.7)
Neutrophils Relative %: 35 %
Platelets: 192 10*3/uL (ref 150–400)
RBC: 5.08 MIL/uL (ref 3.87–5.11)
RDW: 13.7 % (ref 11.5–15.5)
WBC: 5.4 10*3/uL (ref 4.0–10.5)
nRBC: 0 % (ref 0.0–0.2)

## 2021-08-18 LAB — TECHNOLOGIST SMEAR REVIEW: Plt Morphology: ADEQUATE

## 2021-08-18 LAB — HIV ANTIBODY (ROUTINE TESTING W REFLEX): HIV Screen 4th Generation wRfx: NONREACTIVE

## 2021-08-18 LAB — FOLATE: Folate: 39 ng/mL (ref >5.9)

## 2021-08-18 LAB — VITAMIN B12: Vitamin B-12: 1009 pg/mL — ABNORMAL HIGH (ref 180–914)

## 2021-08-18 LAB — HEPATITIS C ANTIBODY: HCV Ab: NONREACTIVE

## 2021-08-18 NOTE — Progress Notes (Signed)
Hematology/Oncology Consult note [facility] Telephone:([phone] [phone]  Patient Care Team: McLean-Scocuzza, Nino Glow, MD as PCP - General (Internal Medicine) Sindy Guadeloupe, MD as Consulting Physician (Hematology and Oncology)   Name of the patient: Nichole Rodriguez  [id number]  Sep 22, 1965    Reason for referral-lymphocytosis   Referring physician-Dr. Mclean-Scocuzza  Date of visit: 08/18/21   History of presenting illness-patient is a 56 year old African-American female with a past medical history significant for hypertension hyperlipidemia prediabetes and migraine referred for lymphocytosis.Her most recent CBC from 07/30/2021 showed white count of 4.9, H&H of 13.5/41.9 and a platelet count of 189.  Differential mainly showed neutropenia with an ANC of 1.7 with 35% neutrophils and relative lymphocytosis of 54.7% although the absolute lymphocyte count was normal at 2.7.  Looking back at her CBCs patient has had borderline low neutrophil counts which was 2.6 back in 2019 and since then has gradually drifted down to 1.7 presently.  Patient reports doing well overall.  Denies any recurrent infections or hospitalizations.  Denies any skin rash or joint pain patient denies any over-the-counter supplements other than multivitamins.  Denies any herbal supplements  ECOG PS- 0  Pain scale- 0   Review of systems- Review of Systems  Constitutional:  Negative for chills, fever, malaise/fatigue and weight loss.  HENT:  Negative for congestion, ear discharge and nosebleeds.   Eyes:  Negative for blurred vision.  Respiratory:  Negative for cough, hemoptysis, sputum production, shortness of breath and wheezing.   Cardiovascular:  Negative for chest pain, palpitations, orthopnea and claudication.  Gastrointestinal:  Negative for abdominal pain, blood in stool, constipation, diarrhea, heartburn, melena, nausea and vomiting.  Genitourinary:  Negative for dysuria,  flank pain, frequency, hematuria and urgency.  Musculoskeletal:  Negative for back pain, joint pain and myalgias.  Skin:  Negative for rash.  Neurological:  Negative for dizziness, tingling, focal weakness, seizures, weakness and headaches.  Endo/Heme/Allergies:  Does not bruise/bleed easily.  Psychiatric/Behavioral:  Negative for depression and suicidal ideas. The patient does not have insomnia.    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   Vitamin D deficiency 06/17/2019   Prediabetes 02/28/2019   HSV infection 02/28/2019   Nonintractable headache 02/28/2019   Duodenal ulcer    Gastroesophageal reflux disease without esophagitis    Dyspepsia    Colon cancer screening    Obesity (BMI 30-39.9) 06/28/2018   Multiple food allergies 06/28/2018   Allergic rhinitis 06/28/2018   Hair loss 06/28/2018   Fatigue 10/20/2017   Insomnia 10/20/2017   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   Pelvic pain in female 07/03/2017   Fibroids 07/03/2017   Essential hypertension 12/08/2016   Annual physical exam 12/08/2016   Genital herpes    Migraine      Past Medical History:  Diagnosis Date   COVID-19    02/2021 paxlovid had   Fibroids    Genital herpes    Genital warts    Hyperlipidemia    Hypertension    Lymphocytosis    Migraine      Past Surgical History:  Procedure Laterality Date   COLONOSCOPY WITH PROPOFOL N/A 08/28/2018   Procedure: COLONOSCOPY WITH PROPOFOL;  Surgeon: Lin Landsman, MD;  Location: [facility];  Service: Gastroenterology;  Laterality: N/A;   ECTOPIC PREGNANCY SURGERY Left    EMBOLIZATION     For fibroid   ESOPHAGOGASTRODUODENOSCOPY  (EGD) WITH PROPOFOL N/A 08/28/2018   Procedure: ESOPHAGOGASTRODUODENOSCOPY (EGD) WITH PROPOFOL;  Surgeon: Lin Landsman, MD;  Location: [place];  Service: Gastroenterology;  Laterality: N/A;    Social History   Socioeconomic History   Marital status: Single    Spouse name: Not on file   Number of children: 1   Years of education: Not on file   Highest education level: Bachelor's degree (e.g., BA, AB, BS)  Occupational History   Not on file  Tobacco Use   Smoking status: Former    Types: Cigarettes    Quit date: 2007    Years since quitting: 16.0   Smokeless tobacco: Never  Vaping Use   Vaping Use: Never used  Substance and Sexual Activity   Alcohol use: Yes    Alcohol/week: 2.0 standard drinks    Types: 2 Glasses of wine per week    Comment: per 2 weeks    Drug use: No   Sexual activity: Yes    Birth control/protection: Condom  Other Topics Concern   Not on file  Social History Narrative   1 daughter    Education officer, 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, applications   Social Determinants of Radio broadcast assistant Strain: Not on file  Food Insecurity: Not on file  Transportation Needs: Not on file  Physical Activity: Not on file  Stress: Not on file  Social Connections: Not on file  Intimate Partner Violence: Not on file     Family History  Problem Relation Age of Onset   Arthritis Mother    Diabetes Mother    Other Mother        brain tumor stroke like sx's   Diabetes Father    Cancer Brother    Breast cancer Maternal Aunt    Ovarian cancer Maternal Aunt    Aneurysm Maternal Grandmother    Cancer Maternal Grandfather    Colon cancer Neg Hx      Current Outpatient Medications:    fluticasone (FLONASE) 50 MCG/ACT nasal spray, Place 1-2 sprays into both nostrils daily., Disp: 16 g, Rfl: 12   hydrocortisone 2.5 % ointment, Apply topically 2 (two) times daily. Prn ears, Disp: 60 g, Rfl: 11   lisinopril-hydrochlorothiazide (ZESTORETIC)  10-12.5 MG tablet, Take 1 tablet by mouth daily. In am, Disp: 90 tablet, Rfl: 3   loratadine (CLARITIN) 10 MG tablet, Take 1 tablet (10 mg total) by mouth daily as needed for allergies., Disp: 90 tablet, Rfl: 3   methocarbamol (ROBAXIN) 500 MG tablet, Take 1 tablet (500 mg total) by mouth at bedtime as needed for muscle spasms., Disp: 90 tablet, Rfl: 3   pantoprazole (PROTONIX) 40 MG tablet, Take 1 tablet (40 mg total) by mouth daily. 30 min before a meal, Disp: 90 tablet, Rfl: 3   pravastatin (PRAVACHOL) 10 MG tablet, Take 1 tablet (10 mg total) by mouth daily. After 6pm, Disp: 90 tablet, Rfl: 3   rizatriptan (MAXALT-MLT) 10 MG disintegrating tablet, Take 1 tablet (10 mg total) by mouth as needed for migraine. May repeat in 2 hours if needed, Disp: 9 tablet, Rfl: 11   valACYclovir (VALTREX) 500 MG tablet, Take 1 tablet (500 mg total) by mouth daily. For prevention bid x 3-7 days outbreak, Disp: 120 tablet, Rfl: 3  VITAMIN D PO, Take by mouth., Disp: , Rfl:    Physical exam:  Vitals:   08/18/21 1108  BP: 115/80  Pulse: (!) 51  Resp: 16  Temp: 97.7 F (36.5 C)  TempSrc: Tympanic  SpO2: 100%  Weight: 230 lb (104.3 kg)  Height: '5\' 8"'  (1.727 m)   Physical Exam Cardiovascular:     Rate and Rhythm: Normal rate and regular rhythm.     Heart sounds: Normal heart sounds.  Pulmonary:     Effort: Pulmonary effort is normal.     Breath sounds: Normal breath sounds.  Abdominal:     General: Bowel sounds are normal.     Palpations: Abdomen is soft.     Comments: No palpable hepatosplenomegaly  Lymphadenopathy:     Comments: No palpable cervical, supraclavicular, axillary or inguinal adenopathy    Skin:    General: Skin is warm and dry.  Neurological:     Mental Status: She is alert and oriented to person, place, and time.       CMP Latest Ref Rng & Units 07/30/2021  Glucose 70 - 99 mg/dL 91  BUN 6 - 23 mg/dL 13  Creatinine 0.40 - 1.20 mg/dL 1.09  Sodium 135 - 145 mEq/L 139   Potassium 3.5 - 5.1 mEq/L 3.7  Chloride 96 - 112 mEq/L 104  CO2 19 - 32 mEq/L 27  Calcium 8.4 - 10.5 mg/dL 9.6  Total Protein 6.0 - 8.3 g/dL 7.1  Total Bilirubin 0.2 - 1.2 mg/dL 0.5  Alkaline Phos 39 - 117 U/L 60  AST 0 - 37 U/L 31  ALT 0 - 35 U/L 34   CBC Latest Ref Rng & Units 08/18/2021  WBC 4.0 - 10.5 K/uL 5.4  Hemoglobin 12.0 - 15.0 g/dL 13.5  Hematocrit 36.0 - 46.0 % 41.9  Platelets 150 - 400 K/uL 192    No images are attached to the encounter.  No results found.  Assessment and plan- Patient is a 56 y.o. female referred for lymphocytosis  Looking at patient's differential she has relative lymphocytosis but her absolute lymphocyte count is normal.  What patient has is neutropenia given that her neutrophil percentage has been around 35% although her neutrophil count remains more than 1500.  Back in 2019 her neutrophil count was 2.6 and then since then has gradually drifted down to 1.9 presently.  Today I will check a CBC with differential, smear review, B12 folate, HIV and hepatitis C.  If overall there is no downward trend in her neutrophil count I will monitor it conservatively without the need for a bone marrow biopsy.  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   Thank you for this kind referral and the opportunity to participate in the care of this patient   Visit Diagnosis 1. Other neutropenia ([place])     Dr. Randa Evens, MD, MPH [facility] [phone] 08/18/2021

## 2021-08-18 NOTE — Telephone Encounter (Signed)
Labs scheduled  

## 2021-09-01 ENCOUNTER — Other Ambulatory Visit: Payer: Self-pay

## 2021-09-01 ENCOUNTER — Encounter: Payer: Self-pay | Admitting: Oncology

## 2021-09-01 ENCOUNTER — Inpatient Hospital Stay (HOSPITAL_BASED_OUTPATIENT_CLINIC_OR_DEPARTMENT_OTHER): Payer: BC Managed Care – PPO | Admitting: Oncology

## 2021-09-01 DIAGNOSIS — Z87891 Personal history of nicotine dependence: Secondary | ICD-10-CM | POA: Diagnosis not present

## 2021-09-01 DIAGNOSIS — D708 Other neutropenia: Secondary | ICD-10-CM | POA: Diagnosis not present

## 2021-09-01 DIAGNOSIS — I10 Essential (primary) hypertension: Secondary | ICD-10-CM | POA: Diagnosis not present

## 2021-09-03 ENCOUNTER — Ambulatory Visit: Payer: BC Managed Care – PPO | Admitting: Oncology

## 2021-09-12 NOTE — Progress Notes (Signed)
I connected with Nichole Rodriguez on 09/12/21 at  3:15 PM EST by video enabled telemedicine visit and verified that I am speaking with the correct person using two identifiers.   I discussed the limitations, risks, security and privacy concerns of performing an evaluation and management service by telemedicine and the availability of in-person appointments. I also discussed with the patient that there may be a patient responsible charge related to this service. The patient expressed understanding and agreed to proceed.  Other persons participating in the visit and their role in the encounter:  none  Patient's location:  home Provider's location:  home  Chief Complaint:  routine f/u of neutropenia  History of present illness: patient is a 56 year old African-American female with a past medical history significant for hypertension hyperlipidemia prediabetes and migraine referred for lymphocytosis.Her most recent CBC from 07/30/2021 showed white count of 4.9, H&H of 13.5/41.9 and a platelet count of 189.  Differential mainly showed neutropenia with an ANC of 1.7 with 35% neutrophils and relative lymphocytosis of 54.7% although the absolute lymphocyte count was normal at 2.7.  Looking back at her CBCs patient has had borderline low neutrophil counts which was 2.6 back in 2019 and since then has gradually drifted down to 1.7 presently.  Patient reports doing well overall.  Denies any recurrent infections or hospitalizations.  Denies any skin rash or joint pain patient denies any over-the-counter supplements other than multivitamins.  Denies any herbal supplements  Results of blood work from 08/18/2021 were as follows: CBC showed white count of 5.4, H&H of 13.5/41.9 and a platelet count of 192.  Differential showed relative neutropenia with 35% neutrophils and an ANC of 1.9.  B12 and folate levels were normal.  HIV and hepatitis C testing negative.  Smear review normal.  Interval history overall patient is  doing well and denies any specific complaints at this time   Review of Systems  Constitutional:  Negative for chills, fever, malaise/fatigue and weight loss.  HENT:  Negative for congestion, ear discharge and nosebleeds.   Eyes:  Negative for blurred vision.  Respiratory:  Negative for cough, hemoptysis, sputum production, shortness of breath and wheezing.   Cardiovascular:  Negative for chest pain, palpitations, orthopnea and claudication.  Gastrointestinal:  Negative for abdominal pain, blood in stool, constipation, diarrhea, heartburn, melena, nausea and vomiting.  Genitourinary:  Negative for dysuria, flank pain, frequency, hematuria and urgency.  Musculoskeletal:  Negative for back pain, joint pain and myalgias.  Skin:  Negative for rash.  Neurological:  Negative for dizziness, tingling, focal weakness, seizures, weakness and headaches.  Endo/Heme/Allergies:  Does not bruise/bleed easily.  Psychiatric/Behavioral:  Negative for depression and suicidal ideas. The patient does not have insomnia.    Allergies  Allergen Reactions   Strawberry (Diagnostic) Hives, Itching and Swelling   Lipitor [Atorvastatin]     Muscle aches legs    Past Medical History:  Diagnosis Date   COVID-19    02/2021 paxlovid had   Fibroids    Genital herpes    Genital warts    Hyperlipidemia    Hypertension    Lymphocytosis    Migraine     Past Surgical History:  Procedure Laterality Date   COLONOSCOPY WITH PROPOFOL N/A 08/28/2018   Procedure: COLONOSCOPY WITH PROPOFOL;  Surgeon: Lin Landsman, MD;  Location: [facility] ENDOSCOPY;  Service: Gastroenterology;  Laterality: N/A;   ECTOPIC PREGNANCY SURGERY Left    EMBOLIZATION     For fibroid   ESOPHAGOGASTRODUODENOSCOPY (EGD) WITH PROPOFOL N/A 08/28/2018  Procedure: ESOPHAGOGASTRODUODENOSCOPY (EGD) WITH PROPOFOL;  Surgeon: Vanga, Rohini Reddy, MD;  Location: [facility] ENDOSCOPY;  Service: Gastroenterology;  Laterality: N/A;  ° ° °Social History   ° °Socioeconomic History  ° Marital status: Single  °  Spouse name: Not on file  ° Number of children: 1  ° Years of education: Not on file  ° Highest education level: Bachelor's degree (e.g., BA, AB, BS)  °Occupational History  ° Not on file  °Tobacco Use  ° Smoking status: Former  °  Types: Cigarettes  °  Quit date: 2007  °  Years since quitting: 16.1  ° Smokeless tobacco: Never  °Vaping Use  ° Vaping Use: Never used  °Substance and Sexual Activity  ° Alcohol use: Yes  °  Alcohol/week: 2.0 standard drinks  °  Types: 2 Glasses of wine per week  °  Comment: per 2 weeks   ° Drug use: No  ° Sexual activity: Yes  °  Birth control/protection: Condom  °Other Topics Concern  ° Not on file  °Social History Narrative  ° 1 daughter   ° 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  ° °Social Determinants of Health  ° °Financial Resource Strain: Not on file  °Food Insecurity: Not on file  °Transportation Needs: Not on file  °Physical Activity: Not on file  °Stress: Not on file  °Social Connections: Not on file  °Intimate Partner Violence: Not on file  ° ° °Family History  °Problem Relation Age of Onset  ° Arthritis Mother   ° Diabetes Mother   ° Other Mother   °     brain tumor stroke like sx's  ° Diabetes Father   ° Cancer Brother   ° Breast cancer Maternal Aunt   ° Ovarian cancer Maternal Aunt   ° Aneurysm Maternal Grandmother   ° Cancer Maternal Grandfather   ° Colon cancer Neg Hx   ° ° ° °Current Outpatient Medications:  °  fluticasone (FLONASE) 50 MCG/ACT nasal spray, Place 1-2 sprays into both nostrils daily., Disp: 16 g, Rfl: 12 °  hydrocortisone 2.5 % ointment, Apply topically 2 (two) times daily. Prn ears, Disp: 60 g, Rfl: 11 °  lisinopril-hydrochlorothiazide (ZESTORETIC) 10-12.5 MG tablet, Take 1 tablet by mouth daily. In am, Disp: 90 tablet, Rfl: 3 °  loratadine (CLARITIN) 10 MG tablet, Take 1 tablet (10 mg total) by mouth daily as needed for allergies., Disp: 90 tablet, Rfl: 3 °   methocarbamol (ROBAXIN) 500 MG tablet, Take 1 tablet (500 mg total) by mouth at bedtime as needed for muscle spasms., Disp: 90 tablet, Rfl: 3 °  pantoprazole (PROTONIX) 40 MG tablet, Take 1 tablet (40 mg total) by mouth daily. 30 min before a meal, Disp: 90 tablet, Rfl: 3 °  pravastatin (PRAVACHOL) 10 MG tablet, Take 1 tablet (10 mg total) by mouth daily. After 6pm, Disp: 90 tablet, Rfl: 3 °  rizatriptan (MAXALT-MLT) 10 MG disintegrating tablet, Take 1 tablet (10 mg total) by mouth as needed for migraine. May repeat in 2 hours if needed, Disp: 9 tablet, Rfl: 11 °  valACYclovir (VALTREX) 500 MG tablet, Take 1 tablet (500 mg total) by mouth daily. For prevention bid x 3-7 days outbreak, Disp: 120 tablet, Rfl: 3 °  VITAMIN D PO, Take by mouth., Disp: , Rfl:  ° °No results found. ° °No images are attached to the encounter. ° ° °CMP Latest Ref Rng & Units 07/30/2021  °Glucose 70 - 99 mg/dL 91  °BUN   6 - 23 mg/dL 13  Creatinine 0.40 - 1.20 mg/dL 1.09  Sodium 135 - 145 mEq/L 139  Potassium 3.5 - 5.1 mEq/L 3.7  Chloride 96 - 112 mEq/L 104  CO2 19 - 32 mEq/L 27  Calcium 8.4 - 10.5 mg/dL 9.6  Total Protein 6.0 - 8.3 g/dL 7.1  Total Bilirubin 0.2 - 1.2 mg/dL 0.5  Alkaline Phos 39 - 117 U/L 60  AST 0 - 37 U/L 31  ALT 0 - 35 U/L 34   CBC Latest Ref Rng & Units 08/18/2021  WBC 4.0 - 10.5 K/uL 5.4  Hemoglobin 12.0 - 15.0 g/dL 13.5  Hematocrit 36.0 - 46.0 % 41.9  Platelets 150 - 400 K/uL 192     Observation/objective: Appears in no acute distress over video visit today.  Breathing is nonlabored  Assessment and plan: Patient is a 56 year old female with history of neutropeniaHere for routine follow-up and discuss results of blood work.    Patient has a normal white count along with hemoglobin and platelet count.  Differential shows neutropenia with 35% neutrophils and relative lymphocytosis.  Her absolute lymphocyte count is normal and what is relatively low is her neutrophil count which has fluctuated between  1.5-2.5.  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  We will repeat CBC with differential in 6 months in 1 year and see her back in 1 year  Follow-up instructions: As above  I discussed the assessment and treatment plan with the patient. The patient was provided an opportunity to ask questions and all were answered. The patient agreed with the plan and demonstrated an understanding of the instructions.   The patient was advised to call back or seek an in-person evaluation if the symptoms worsen or if the condition fails to improve as anticipated.  Visit Diagnosis: 1. Other neutropenia ([place])     Dr. Randa Evens, MD, MPH [facility] at [facility] Tel- [phone] 09/12/2021 10:15 PM

## 2021-09-30 ENCOUNTER — Other Ambulatory Visit: Payer: Self-pay | Admitting: Internal Medicine

## 2021-10-31 ENCOUNTER — Other Ambulatory Visit: Payer: Self-pay | Admitting: Internal Medicine

## 2021-10-31 DIAGNOSIS — I10 Essential (primary) hypertension: Secondary | ICD-10-CM

## 2021-11-01 ENCOUNTER — Other Ambulatory Visit: Payer: BC Managed Care – PPO

## 2021-11-02 ENCOUNTER — Ambulatory Visit: Payer: BC Managed Care – PPO | Admitting: Internal Medicine

## 2021-12-02 ENCOUNTER — Other Ambulatory Visit: Payer: Self-pay | Admitting: Internal Medicine

## 2021-12-02 DIAGNOSIS — I10 Essential (primary) hypertension: Secondary | ICD-10-CM

## 2021-12-05 ENCOUNTER — Other Ambulatory Visit: Payer: Self-pay | Admitting: Internal Medicine

## 2021-12-05 DIAGNOSIS — K219 Gastro-esophageal reflux disease without esophagitis: Secondary | ICD-10-CM

## 2021-12-09 ENCOUNTER — Telehealth: Payer: Self-pay

## 2021-12-10 ENCOUNTER — Encounter: Payer: Self-pay | Admitting: Internal Medicine

## 2021-12-10 ENCOUNTER — Ambulatory Visit: Payer: BC Managed Care – PPO | Admitting: Internal Medicine

## 2021-12-10 VITALS — BP 110/70 | HR 54 | Temp 97.9°F | Resp 14 | Ht 68.0 in | Wt 226.0 lb

## 2021-12-10 DIAGNOSIS — R7303 Prediabetes: Secondary | ICD-10-CM

## 2021-12-10 DIAGNOSIS — R944 Abnormal results of kidney function studies: Secondary | ICD-10-CM | POA: Diagnosis not present

## 2021-12-10 DIAGNOSIS — Z113 Encounter for screening for infections with a predominantly sexual mode of transmission: Secondary | ICD-10-CM

## 2021-12-10 DIAGNOSIS — I10 Essential (primary) hypertension: Secondary | ICD-10-CM | POA: Diagnosis not present

## 2021-12-10 DIAGNOSIS — E785 Hyperlipidemia, unspecified: Secondary | ICD-10-CM

## 2021-12-10 DIAGNOSIS — H109 Unspecified conjunctivitis: Secondary | ICD-10-CM | POA: Diagnosis not present

## 2021-12-10 DIAGNOSIS — Z6834 Body mass index (BMI) 34.0-34.9, adult: Secondary | ICD-10-CM

## 2021-12-10 DIAGNOSIS — H1013 Acute atopic conjunctivitis, bilateral: Secondary | ICD-10-CM | POA: Diagnosis not present

## 2021-12-10 DIAGNOSIS — J309 Allergic rhinitis, unspecified: Secondary | ICD-10-CM

## 2021-12-10 LAB — LIPID PANEL
Cholesterol: 202 mg/dL — ABNORMAL HIGH (ref 0–200)
HDL: 83.3 mg/dL (ref >39.00)
LDL Cholesterol: 107 mg/dL — ABNORMAL HIGH (ref 0–99)
NonHDL: 118.93
Total CHOL/HDL Ratio: 2
Triglycerides: 58 mg/dL (ref 0.0–149.0)
VLDL: 11.6 mg/dL (ref 0.0–40.0)

## 2021-12-10 LAB — BASIC METABOLIC PANEL
BUN: 15 mg/dL (ref 6–23)
CO2: 27 mEq/L (ref 19–32)
Calcium: 9.9 mg/dL (ref 8.4–10.5)
Chloride: 103 mEq/L (ref 96–112)
Creatinine, Ser: 1.1 mg/dL (ref 0.40–1.20)
GFR: 57.37 mL/min — ABNORMAL LOW (ref >60.00)
Glucose, Bld: 90 mg/dL (ref 70–99)
Potassium: 4 mEq/L (ref 3.5–5.1)
Sodium: 139 mEq/L (ref 135–145)

## 2021-12-10 MED ORDER — PATADAY 0.7 % OP SOLN: 1.0000 [drp] | Freq: Every day | OPHTHALMIC | 11 refills | Status: DC

## 2021-12-10 MED ORDER — ERYTHROMYCIN 5 MG/GM OP OINT: 1.0000 "application " | TOPICAL_OINTMENT | Freq: Three times a day (TID) | OPHTHALMIC | 0 refills | Status: DC

## 2021-12-10 MED ORDER — RIZATRIPTAN BENZOATE 10 MG PO TBDP: 10.0000 mg | ORAL_TABLET | ORAL | 11 refills | Status: DC | PRN

## 2021-12-10 MED ORDER — WEGOVY 0.25 MG/0.5ML ~~LOC~~ SOAJ: 0.2500 mg | SUBCUTANEOUS | 0 refills | Status: DC

## 2021-12-10 MED ORDER — WEGOVY 2.4 MG/0.75ML ~~LOC~~ SOAJ: 2.4000 mg | SUBCUTANEOUS | 5 refills | Status: DC

## 2021-12-10 MED ORDER — WEGOVY 1.7 MG/0.75ML ~~LOC~~ SOAJ: 1.7000 mg | SUBCUTANEOUS | 0 refills | Status: DC

## 2021-12-10 MED ORDER — WEGOVY 0.5 MG/0.5ML ~~LOC~~ SOAJ: 0.5000 mg | SUBCUTANEOUS | 0 refills | Status: DC

## 2021-12-10 MED ORDER — LORATADINE 10 MG PO TABS: 10.0000 mg | ORAL_TABLET | Freq: Every day | ORAL | 3 refills | Status: DC | PRN

## 2021-12-10 MED ORDER — WEGOVY 1 MG/0.5ML ~~LOC~~ SOAJ
1.0000 mg | SUBCUTANEOUS | 0 refills | Status: DC
Start: 2021-12-10 — End: 2022-06-06

## 2021-12-10 MED ORDER — LISINOPRIL-HYDROCHLOROTHIAZIDE 10-12.5 MG PO TABS: 1.0000 | ORAL_TABLET | Freq: Every morning | ORAL | 3 refills | Status: DC

## 2021-12-10 NOTE — Progress Notes (Signed)
Chief Complaint  Patient presents with   Conjunctivitis    Pt saw client w/pink eye last wk. Pt c/o L eye feeling puffy,itchy,& some drainage, pt does have allergies but has not taken any meds or eye drops. Denies any pain.    F/u  1.c/w pink eye client had pink eye and left eye watery itchy was red and puffy and draining no otc meds tried no pain  2. Htn zestoric 10-12.5 controlled today  3.obesity wants to try wegovy wt lowest 220 she is working out and trying to eat right but no change     Review of Systems  Constitutional:  Negative for weight loss.  HENT:  Negative for hearing loss.   Eyes:  Negative for blurred vision.  Respiratory:  Negative for shortness of breath.   Cardiovascular:  Negative for chest pain.  Gastrointestinal:  Negative for abdominal pain and blood in stool.  Genitourinary:  Negative for dysuria.  Musculoskeletal:  Negative for falls and joint pain.  Skin:  Negative for rash.  Neurological:  Negative for headaches.  Psychiatric/Behavioral:  Negative for depression.   Past Medical History:  Diagnosis Date   COVID-19    02/2021 paxlovid had   Fibroids    Genital herpes    Genital warts    Hyperlipidemia    Hypertension    Lymphocytosis    Migraine    Past Surgical History:  Procedure Laterality Date   COLONOSCOPY WITH PROPOFOL N/A 08/28/2018   Procedure: COLONOSCOPY WITH PROPOFOL;  Surgeon: Lin Landsman, MD;  Location: [facility] ENDOSCOPY;  Service: Gastroenterology;  Laterality: N/A;   ECTOPIC PREGNANCY SURGERY Left    EMBOLIZATION     For fibroid   ESOPHAGOGASTRODUODENOSCOPY (EGD) WITH PROPOFOL N/A 08/28/2018   Procedure: ESOPHAGOGASTRODUODENOSCOPY (EGD) WITH PROPOFOL;  Surgeon: Lin Landsman, MD;  Location: [place];  Service: Gastroenterology;  Laterality: N/A;   Family History  Problem Relation Age of Onset   Arthritis Mother    Diabetes Mother    Other Mother        brain tumor stroke like sx's   Diabetes Father    Cancer  Brother    Breast cancer Maternal Aunt    Ovarian cancer Maternal Aunt    Aneurysm Maternal Grandmother    Cancer Maternal Grandfather    Colon cancer Neg Hx    Social History   Socioeconomic History   Marital status: Single    Spouse name: Not on file   Number of children: 1   Years of education: Not on file   Highest education level: Bachelor's degree (e.g., BA, AB, BS)  Occupational History   Not on file  Tobacco Use   Smoking status: Former    Types: Cigarettes    Quit date: 2007    Years since quitting: 16.4   Smokeless tobacco: Never  Vaping Use   Vaping Use: Never used  Substance and Sexual Activity   Alcohol use: Yes    Alcohol/week: 2.0 standard drinks    Types: 2 Glasses of wine per week    Comment: per 2 weeks    Drug use: No   Sexual activity: Yes    Birth control/protection: Condom  Other Topics Concern   Not on file  Social History Narrative   1 daughter    Education officer, 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 into both eyes 3 (three) times daily. X 4-7 days   fluticasone (FLONASE) 50 MCG/ACT nasal spray Place 1-2 sprays into both nostrils daily.   hydrocortisone 2.5 % ointment Apply topically 2 (two) times daily. Prn ears   methocarbamol (ROBAXIN) 500 MG tablet Take 1 tablet (500 mg total) by mouth at bedtime as needed for muscle spasms.   Olopatadine HCl (PATADAY) 0.7 % SOLN Apply 1 drop to eye daily. prn   pantoprazole (PROTONIX) 40 MG tablet TAKE 1 TABLET BY MOUTH ONCE DAILY (TAKE  30  MINUTES  BEFORE  A  MEAL)   pravastatin (PRAVACHOL) 10 MG tablet Take 1 tablet (10 mg total) by  mouth daily. After 6pm   Semaglutide-Weight Management (WEGOVY) 0.25 MG/0.5ML SOAJ Inject 0.25 mg into the skin once a week.   Semaglutide-Weight Management (WEGOVY) 0.5 MG/0.5ML SOAJ Inject 0.5 mg into the skin once a week.   Semaglutide-Weight Management (WEGOVY) 1 MG/0.5ML SOAJ Inject 1 mg into the skin once a week.   Semaglutide-Weight Management (WEGOVY) 1.7 MG/0.75ML SOAJ Inject 1.7 mg into the skin once a week.   Semaglutide-Weight Management (WEGOVY) 2.4 MG/0.75ML SOAJ Inject 2.4 mg into the skin once a week.   valACYclovir (VALTREX) 500 MG tablet Take 1 tablet (500 mg total) by mouth daily. For prevention bid x 3-7 days outbreak   [DISCONTINUED] lisinopril-hydrochlorothiazide (ZESTORETIC) 10-12.5 MG tablet TAKE 1 TABLET BY MOUTH ONCE DAILY IN THE MORNING   [DISCONTINUED] loratadine (CLARITIN) 10 MG tablet Take 1 tablet (10 mg total) by mouth daily as needed for allergies.   [DISCONTINUED] rizatriptan (MAXALT-MLT) 10 MG disintegrating tablet Take 1 tablet (10 mg total) by mouth as needed for migraine. May repeat in 2 hours if needed   Allergies  Allergen Reactions   Strawberry (Diagnostic) Hives, Itching and Swelling   Lipitor [Atorvastatin]     Muscle aches legs   No results found for this or any previous visit (from the past 2160 hour(s)). Objective  Body mass index is 34.36 kg/m. Wt Readings from Last 3 Encounters:  12/10/21 226 lb (102.5 kg)  08/18/21 230 lb (104.3 kg)  08/04/21 230 lb 3.2 oz (104.4 kg)   Temp Readings from Last 3 Encounters:  12/10/21 97.9 F (36.6 C) (Oral)  08/18/21 97.7 F (36.5 C) (Tympanic)  08/04/21 (!) 96.9 F (36.1 C) (Temporal)   BP Readings from Last 3 Encounters:  12/10/21 110/70  08/18/21 115/80  08/04/21 110/70   Pulse Readings from Last 3 Encounters:  12/10/21 (!) 54  08/18/21 (!) 51  08/04/21 (!) 56    Physical Exam Vitals and nursing note reviewed.  Constitutional:      Appearance: Normal appearance. She is well-developed  and well-groomed.  HENT:     Head: Normocephalic and atraumatic.  Eyes:     Conjunctiva/sclera: Conjunctivae normal.     Pupils: Pupils are equal, round, and reactive to light.  Cardiovascular:     Rate and Rhythm: Normal rate and regular rhythm.     Heart sounds: Normal heart sounds. No murmur heard. Pulmonary:     Effort: Pulmonary effort is normal.     Breath sounds: Normal breath sounds.  Abdominal:     General: Abdomen is flat. Bowel sounds are normal.  Tenderness: There is no abdominal tenderness.  Musculoskeletal:        General: No tenderness.  Skin:    General: Skin is warm and dry.  Neurological:     General: No focal deficit present.     Mental Status: She is alert and oriented to person, place, and time. Mental status is at baseline.     Cranial Nerves: Cranial nerves 2-12 are intact.     Motor: Motor function is intact.     Coordination: Coordination is intact.     Gait: Gait is intact.  Psychiatric:        Attention and Perception: Attention and perception normal.        Mood and Affect: Mood and affect normal.        Speech: Speech normal.        Behavior: Behavior normal. Behavior is cooperative.        Thought Content: Thought content normal.        Cognition and Memory: Cognition and memory normal.        Judgment: Judgment normal.    Assessment  Plan  Conjunctivitis, unspecified conjunctivitis type, unspecified laterality - Plan: erythromycin ophthalmic ointment  Allergic conjunctivitis of both eyes - Plan: Olopatadine HCl (PATADAY) 0.7 % SOLN  Hyperlipidemia, unspecified hyperlipidemia type - Plan: Semaglutide-Weight Management (WEGOVY) 0.25 MG/0.5ML SOAJ, Semaglutide-Weight Management (WEGOVY) 0.5 MG/0.5ML SOAJ, Semaglutide-Weight Management (WEGOVY) 1 MG/0.5ML SOAJ, Semaglutide-Weight Management (WEGOVY) 1.7 MG/0.75ML SOAJ, Semaglutide-Weight Management (WEGOVY) 2.4 MG/0.75ML SOAJ, HSV(herpes simplex vrs) 1+2 ab-IgG, RPR, Lipid panel, Basic Metabolic  Panel (BMET)  Hypertension, controlled - Plan: lisinopril-hydrochlorothiazide (ZESTORETIC) 10-12.5 MG tablet  Essential hypertension - Plan: lisinopril-hydrochlorothiazide (ZESTORETIC) 10-12.5 MG tablet, Semaglutide-Weight Management (WEGOVY) 0.25 MG/0.5ML SOAJ, Semaglutide-Weight Management (WEGOVY) 0.5 MG/0.5ML SOAJ, Semaglutide-Weight Management (WEGOVY) 1 MG/0.5ML SOAJ, Semaglutide-Weight Management (WEGOVY) 1.7 MG/0.75ML SOAJ, Semaglutide-Weight Management (WEGOVY) 2.4 MG/0.75ML SOAJ  Allergic rhinitis, unspecified seasonality, unspecified trigger - Plan: loratadine (CLARITIN) 10 MG tablet  Prediabetes - Plan: Semaglutide-Weight Management (WEGOVY) 0.25 MG/0.5ML SOAJ, Semaglutide-Weight Management (WEGOVY) 0.5 MG/0.5ML SOAJ, Semaglutide-Weight Management (WEGOVY) 1 MG/0.5ML SOAJ, Semaglutide-Weight Management (WEGOVY) 1.7 MG/0.75ML SOAJ, Semaglutide-Weight Management (WEGOVY) 2.4 MG/0.75ML SOAJ  BMI 34.0-34.9,adult - Plan: Semaglutide-Weight Management (WEGOVY) 0.25 MG/0.5ML SOAJ, Semaglutide-Weight Management (WEGOVY) 0.5 MG/0.5ML SOAJ, Semaglutide-Weight Management (WEGOVY) 1 MG/0.5ML SOAJ, Semaglutide-Weight Management (WEGOVY) 1.7 MG/0.75ML SOAJ, Semaglutide-Weight Management (WEGOVY) 2.4 MG/0.75ML SOAJ  Screening for venereal disease - Plan: HSV(herpes simplex vrs) 1+2 ab-IgG, RPR, Lipid panel, Basic Metabolic Panel (BMET)  Decreased GFR - Plan: HSV(herpes simplex vrs) 1+2 ab-IgG, RPR, Lipid panel, Basic Metabolic Panel (BMET)   HM Flu shot utd Tdap had 12/07/16 Hep B 3/3 immune Pfizer 3/3 consider booster  rec shingrix as well 06/02/20, 09/29/20, 08/04/21    Pap neg 07/05/17 neg hpv Dr. Enzo Bi ([place])  -+ cervical stenosis  01/26/21 negative repeat in 3 years    mammo neg 02/09/21 neg ordered 02/09/2022   Colonoscopy 08/28/18 normal f/u 10  years    Rec healthy diet and exercise  Provider: Dr. Olivia Mackie McLean-Scocuzza-Internal Medicine

## 2021-12-10 NOTE — Patient Instructions (Addendum)
[facility] Dr. Michell Heinrich   Similasan complete eye relief [facility]   Consider shingles shot x 2 doses  Call back and schedule a nurse visit    Semaglutide Injection (Weight Management) What is this medication? SEMAGLUTIDE (SEM a GLOO tide) promotes weight loss. It may also be used to maintain weight loss. It works by decreasing appetite. Changes to diet and exercise are often combined with this medication. This medicine may be used for other purposes; ask your health care provider or pharmacist if you have questions. COMMON BRAND NAME(S): TDVVOH What should I tell my care team before I take this medication? They need to know if you have any of these conditions: Endocrine tumors (MEN 2) or if someone in your family had these tumors Eye disease, vision problems Gallbladder disease History of depression or mental health disease History of pancreatitis Kidney disease Stomach or intestine problems Suicidal thoughts, plans, or attempt; a previous suicide attempt by you or a family member Thyroid cancer or if someone in your family had thyroid cancer An unusual or allergic reaction to semaglutide, other medications, foods, dyes, or preservatives Pregnant or trying to get pregnant Breast-feeding How should I use this medication? This medication is injected under the skin. You will be taught how to prepare and give it. Take it as directed on the prescription label. It is given once every week (every 7 days). Keep taking it unless your care team tells you to stop. It is important that you put your used needles and pens in a special sharps container. Do not put them in a trash can. If you do not have a sharps container, call your pharmacist or care team to get one. A special MedGuide will be given to you by the pharmacist with each prescription and refill. Be sure to read this information carefully each time. This medication comes with INSTRUCTIONS FOR USE. Ask your pharmacist for  directions on how to use this medication. Read the information carefully. Talk to your pharmacist or care team if you have questions. Talk to your care team about the use of this medication in children. While it may be prescribed for children as young as 12 years for selected conditions, precautions do apply. Overdosage: If you think you have taken too much of this medicine contact a poison control center or emergency room at once. NOTE: This medicine is only for you. Do not share this medicine with others. What if I miss a dose? If you miss a dose and the next scheduled dose is more than 2 days away, take the missed dose as soon as possible. If you miss a dose and the next scheduled dose is less than 2 days away, do not take the missed dose. Take the next dose at your regular time. Do not take double or extra doses. If you miss your dose for 2 weeks or more, take the next dose at your regular time or call your care team to talk about how to restart this medication. What may interact with this medication? Insulin and other medications for diabetes This list may not describe all possible interactions. Give your health care provider a list of all the medicines, herbs, non-prescription drugs, or dietary supplements you use. Also tell them if you smoke, drink alcohol, or use illegal drugs. Some items may interact with your medicine. What should I watch for while using this medication? Visit your care team for regular checks on your progress. It may be some time before you see the  benefit from this medication. Drink plenty of fluids while taking this medication. Check with your care team if you have severe diarrhea, nausea, and vomiting, or if you sweat a lot. The loss of too much body fluid may make it dangerous for you to take this medication. This medication may affect blood sugar levels. Ask your care team if changes in diet or medications are needed if you have diabetes. If you or your family notice any  changes in your behavior, such as new or worsening depression, thoughts of harming yourself, anxiety, other unusual or disturbing thoughts, or memory loss, call your care team right away. Women should inform their care team if they wish to become pregnant or think they might be pregnant. Losing weight while pregnant is not advised and may cause harm to the unborn child. Talk to your care team for more information. What side effects may I notice from receiving this medication? Side effects that you should report to your care team as soon as possible: Allergic reactions--skin rash, itching, hives, swelling of the face, lips, tongue, or throat Change in vision Dehydration--increased thirst, dry mouth, feeling faint or lightheaded, headache, dark yellow or brown urine Gallbladder problems--severe stomach pain, nausea, vomiting, fever Heart palpitations--rapid, pounding, or irregular heartbeat Kidney injury--decrease in the amount of urine, swelling of the ankles, hands, or feet Pancreatitis--severe stomach pain that spreads to your back or gets worse after eating or when touched, fever, nausea, vomiting Thoughts of suicide or self-harm, worsening mood, feelings of depression Thyroid cancer--new mass or lump in the neck, pain or trouble swallowing, trouble breathing, hoarseness Side effects that usually do not require medical attention (report to your care team if they continue or are bothersome): Diarrhea Loss of appetite Nausea Stomach pain Vomiting This list may not describe all possible side effects. Call your doctor for medical advice about side effects. You may report side effects to FDA at 1-800-FDA-1088. Where should I keep my medication? Keep out of the reach of children and pets. Refrigeration (preferred): Store in the refrigerator. Do not freeze. Keep this medication in the original container until you are ready to take it. Get rid of any unused medication after the expiration date. Room  temperature: If needed, prior to cap removal, the pen can be stored at room temperature for up to 28 days. Protect from light. If it is stored at room temperature, get rid of any unused medication after 28 days or after it expires, whichever is first. It is important to get rid of the medication as soon as you no longer need it or it is expired. You can do this in two ways: Take the medication to a medication take-back program. Check with your pharmacy or law enforcement to find a location. If you cannot return the medication, follow the directions in the [place]. NOTE: This sheet is a summary. It may not cover all possible information. If you have questions about this medicine, talk to your doctor, pharmacist, or health care provider.  2023 Elsevier/Gold Standard (2021-07-21 00:00:00)  Allergic Conjunctivitis, Adult Allergic conjunctivitis is inflammation of the conjunctiva. The conjunctiva is the thin, clear membrane that covers the white part of the eye and the inner surface of the eyelid. In this condition: The blood vessels in the conjunctiva become irritated and swell. The eyes become red or pink and feel itchy. Allergic conjunctivitis cannot be spread from person to person. This condition can develop at any age and may be outgrown. What are the causes? This condition  is caused by allergens. These are things that can cause an allergic reaction in some people but not in other people. Common allergens include: Outdoor allergens, such as: Pollen, including pollen from grass and weeds. Mold spores. Car fumes. Indoor allergens, such as: Dust. Smoke. Mold spores. Proteins in a pet's urine, saliva, or dander. What increases the risk? You may be more likely to develop this condition if you have a family history of these things: Allergies. Conditions caused by being exposed to allergens, such as: Allergic rhinitis. This is an allergic reaction that affects the nose. Bronchial asthma. This  condition affects the large airways in the lungs and makes breathing difficult. Atopic dermatitis (eczema). This is inflammation of the skin that is long-term (chronic). What are the signs or symptoms? Symptoms of this condition include eyes that are: Itchy. Red. Watery. Puffy. Your eyes may also: Sting or burn. Have clear fluid draining from them. Have thick mucus discharge and pain (vernal conjunctivitis). How is this diagnosed? This condition may be diagnosed by: Your medical history. A physical exam. Tests of the fluid draining from your eyes to rule out other causes. Other tests to confirm the diagnosis, including: Testing for allergies. The skin may be pricked with a tiny needle. The pricked area is then exposed to small amounts of allergens. Testing for other eye conditions. Tests may include: Blood tests. Tissue scrapings from your eyelid. The tissue is then checked under a microscope. How is this treated? This condition may be treated with: Cold, wet cloths (cold compresses) to soothe itching and swelling. Washing the face to remove allergens. Eye drops. These may be prescription or over-the-counter. You may need to try different types to see which one works best for you, such as: Eye drops that block the allergic reaction (antihistamine). Eye drops that reduce swelling and irritation (anti-inflammatory). Steroid eye drops, which may be given if other treatments have not worked (vernal conjunctivitis). Oral antihistamine medicines. These are medicines taken by mouth to lessen your allergic reaction. You may need these if eye drops do not help or are difficult to use. Follow these instructions at home: Eye care Apply a clean, cold compress to your eyes for 10-20 minutes, 3-4 times a day. Do not touch or rub your eyes. Do not wear contact lenses until the inflammation is gone. Wear glasses instead. Do not wear eye makeup until the inflammation is gone. General  instructions Avoid known allergens whenever possible. Take or apply over-the-counter and prescription medicines only as told by your health care provider. These include any eye drops. Drink enough fluid to keep your urine pale yellow. Keep all follow-up visits as told by your health care provider. This is important. Contact a health care provider if: Your symptoms get worse or do not get better with treatment. You have mild eye pain. You become sensitive to light. You have spots or blisters on your eyes. You have pus draining from your eyes. You have a fever. Get help right away if: You have redness, swelling, or other symptoms in only one eye. Your vision is blurred or you have other vision changes. You have severe eye pain. Summary Allergic conjunctivitis is inflammation of the clear membrane that covers the white part of the eye and the inner surface of the eyelid. Take or apply over-the-counter and prescription medicines only as told by your health care provider. These include eye drops. Do not touch or rub your eyes. Contact a health care provider if your symptoms get worse or  do not get better with treatment. This information is not intended to replace advice given to you by your health care provider. Make sure you discuss any questions you have with your health care provider. Document Revised: 05/24/2019 Document Reviewed: 05/27/2019 Elsevier Patient Education  [place].  Bacterial Conjunctivitis, Adult Bacterial conjunctivitis is an infection of the clear membrane that covers the white part of the eye and the inner surface of the eyelid (conjunctiva). When the blood vessels in the conjunctiva become inflamed, the eye becomes red or pink. The eye often feels irritated or itchy. Bacterial conjunctivitis spreads easily from person to person (is contagious). It also spreads easily from one eye to the other eye. What are the causes? This condition is caused by bacteria. You  may get the infection if you come into close contact with: A person who is infected with the bacteria. Items that are contaminated with the bacteria, such as a face towel, contact lens solution, or eye makeup. What increases the risk? You are more likely to develop this condition if: You are exposed to other people who have the infection. You wear contact lenses. You have a sinus infection. You have had a recent eye injury or surgery. You have a weak body defense system (immune system). You have a medical condition that causes dry eyes. What are the signs or symptoms? Symptoms of this condition include: Thick, yellowish discharge from the eye. This may turn into a crust on the eyelid overnight and cause your eyelids to stick together. Tearing or watery eyes. Itchy eyes. Burning feeling in your eyes. Eye redness. Swollen eyelids. Blurred vision. How is this diagnosed? This condition is diagnosed based on your symptoms and medical history. Your health care provider may also take a sample of discharge from your eye to find the cause of your infection. How is this treated? This condition may be treated with: Antibiotic eye drops or ointment to clear the infection more quickly and prevent the spread of infection to others. Antibiotic medicines taken by mouth (orally) to treat infections that do not respond to drops or ointments or that last longer than 10 days. Cool, wet cloths (cool compresses) placed on the eyes. Artificial tears applied 2-6 times a day. Follow these instructions at home: Medicines Take or apply your antibiotic medicine as told by your health care provider. Do not stop using the antibiotic, even if your condition improves, unless directed by your health care provider. Take or apply over-the-counter and prescription medicines only as told by your health care provider. Be very careful to avoid touching the edge of your eyelid with the eye-drop bottle or the ointment tube  when you apply medicines to the affected eye. This will keep you from spreading the infection to your other eye or to other people. Managing discomfort Gently wipe away any drainage from your eye with a warm, wet washcloth or a cotton ball. Apply a clean, cool compress to your eye for 10-20 minutes, 3-4 times a day. General instructions Do not wear contact lenses until the inflammation is gone and your health care provider says it is safe to wear them again. Ask your health care provider how to sterilize or replace your contact lenses before you use them again. Wear glasses until you can resume wearing contact lenses. Avoid wearing eye makeup until the inflammation is gone. Throw away any old eye cosmetics that may be contaminated. Change or wash your pillowcase every day. Do not share towels or washcloths. This may  spread the infection. Wash your hands often with soap and water for at least 20 seconds and especially before touching your face or eyes. Use paper towels to dry your hands. Avoid touching or rubbing your eyes. Do not drive or use heavy machinery if your vision is blurred. Contact a health care provider if: You have a fever. Your symptoms do not get better after 10 days. Get help right away if: You have a fever and your symptoms suddenly get worse. You have severe pain when you move your eye. You have facial pain, redness, or swelling. You have a sudden loss of vision. Summary Bacterial conjunctivitis is an infection of the clear membrane that covers the white part of the eye and the inner surface of the eyelid (conjunctiva). Bacterial conjunctivitis spreads easily from eye to eye and from person to person (is contagious). Wash your hands often with soap and water for at least 20 seconds and especially before touching your face or eyes. Use paper towels to dry your hands. Take or apply your antibiotic medicine as told by your health care provider. Do not stop using the antibiotic  even if your condition improves. Contact a health care provider if you have a fever or if your symptoms do not get better after 10 days. Get help right away if you have a sudden loss of vision. This information is not intended to replace advice given to you by your health care provider. Make sure you discuss any questions you have with your health care provider. Document Revised: 10/14/2020 Document Reviewed: 10/14/2020 Elsevier Patient Education  [place].

## 2021-12-11 LAB — RPR: RPR Ser Ql: NONREACTIVE

## 2021-12-11 LAB — HSV(HERPES SIMPLEX VRS) I + II AB-IGG
HAV 1 IGG,TYPE SPECIFIC AB: <0.9 index
HSV 2 IGG,TYPE SPECIFIC AB: 9.62 index — ABNORMAL HIGH

## 2021-12-15 ENCOUNTER — Telehealth: Payer: Self-pay

## 2021-12-15 NOTE — Telephone Encounter (Signed)
Started PA for pt's Semaglutide-Weight Management (WEGOVY) 0.25 MG/0.5ML SOAJ on 12/15/21 via [email or web address] on 12/15/21 faxed along clinical notes to [phone]  Prior [facility] [id number] Drug/Service Name: WEGOVY 0.25 MG/0.5 ML PEN Patient:Nichole Rodriguez  Date Requested:12/15/2021 11:49:40 AM   BSWHQPRF:[id number]  DOB: Dec 03, 1965  Awaiting denial or approval

## 2021-12-17 ENCOUNTER — Other Ambulatory Visit: Payer: BC Managed Care – PPO

## 2021-12-21 ENCOUNTER — Ambulatory Visit: Payer: BC Managed Care – PPO | Admitting: Internal Medicine

## 2022-03-14 ENCOUNTER — Inpatient Hospital Stay: Payer: BC Managed Care – PPO | Attending: Oncology

## 2022-03-14 DIAGNOSIS — D709 Neutropenia, unspecified: Secondary | ICD-10-CM | POA: Insufficient documentation

## 2022-03-14 DIAGNOSIS — D708 Other neutropenia: Secondary | ICD-10-CM

## 2022-03-14 LAB — CBC WITH DIFFERENTIAL/PLATELET
Abs Immature Granulocytes: 0.03 10*3/uL (ref 0.00–0.07)
Basophils Absolute: 0.1 10*3/uL (ref 0.0–0.1)
Basophils Relative: 1 %
Eosinophils Absolute: 0.1 10*3/uL (ref 0.0–0.5)
Eosinophils Relative: 2 %
HCT: 42.3 % (ref 36.0–46.0)
Hemoglobin: 13.7 g/dL (ref 12.0–15.0)
Immature Granulocytes: 1 %
Lymphocytes Relative: 44 %
Lymphs Abs: 2.5 10*3/uL (ref 0.7–4.0)
MCH: 27.5 pg (ref 26.0–34.0)
MCHC: 32.4 g/dL (ref 30.0–36.0)
MCV: 84.8 fL (ref 80.0–100.0)
Monocytes Absolute: 0.6 10*3/uL (ref 0.1–1.0)
Monocytes Relative: 10 %
Neutro Abs: 2.3 10*3/uL (ref 1.7–7.7)
Neutrophils Relative %: 42 %
Platelets: 186 10*3/uL (ref 150–400)
RBC: 4.99 MIL/uL (ref 3.87–5.11)
RDW: 13.6 % (ref 11.5–15.5)
WBC: 5.6 10*3/uL (ref 4.0–10.5)
nRBC: 0 % (ref 0.0–0.2)

## 2022-03-24 ENCOUNTER — Ambulatory Visit: Payer: BC Managed Care – PPO | Admitting: Internal Medicine

## 2022-03-24 ENCOUNTER — Other Ambulatory Visit (HOSPITAL_COMMUNITY)
Admission: RE | Admit: 2022-03-24 | Discharge: 2022-03-24 | Disposition: A | Discharge status: Home / Self Care | Payer: BC Managed Care – PPO | Source: Ambulatory Visit | Attending: Internal Medicine | Admitting: Internal Medicine

## 2022-03-24 ENCOUNTER — Encounter: Payer: Self-pay | Admitting: Internal Medicine

## 2022-03-24 VITALS — BP 118/82 | HR 65 | Temp 98.0°F | Ht 68.0 in | Wt 236.0 lb

## 2022-03-24 DIAGNOSIS — N3000 Acute cystitis without hematuria: Secondary | ICD-10-CM

## 2022-03-24 DIAGNOSIS — R102 Pelvic and perineal pain: Secondary | ICD-10-CM

## 2022-03-24 DIAGNOSIS — R109 Unspecified abdominal pain: Secondary | ICD-10-CM

## 2022-03-24 DIAGNOSIS — D219 Benign neoplasm of connective and other soft tissue, unspecified: Secondary | ICD-10-CM | POA: Diagnosis not present

## 2022-03-24 DIAGNOSIS — R1030 Lower abdominal pain, unspecified: Secondary | ICD-10-CM | POA: Diagnosis not present

## 2022-03-24 DIAGNOSIS — N760 Acute vaginitis: Secondary | ICD-10-CM

## 2022-03-24 NOTE — Patient Instructions (Addendum)
Dr. Marcelline Mates  Phone Fax E-mail Address  [phone] Not available [place] [address]     Specialties     Obstetrics and Gynecology, Radiology      Bacterial Vaginosis  Bacterial vaginosis is an infection that occurs when the normal balance of bacteria in the vagina changes. This change is caused by an overgrowth of certain bacteria in the vagina. Bacterial vaginosis is the most common vaginal infection among females aged 36 to 38 years. This condition increases the risk of sexually transmitted infections (STIs). Treatment can help reduce this risk. Treatment is very important for pregnant women because this condition can cause babies to be born early (prematurely) or at a low birth weight. What are the causes? This condition is caused by an increase in harmful bacteria that are normally present in small amounts in the vagina. However, the exact reason this condition develops is not known. You cannot get bacterial vaginosis from toilet seats, bedding, swimming pools, or contact with objects around you. What increases the risk? The following factors may make you more likely to develop this condition: Having a new sexual partner or multiple sexual partners, or having unprotected sex. Douching. Having an intrauterine device (IUD). Smoking. Abusing drugs and alcohol. This may lead to riskier sexual behavior. Taking certain antibiotic medicines. Being pregnant. What are the signs or symptoms? Some women with this condition have no symptoms. Symptoms may include: Pearline Cables or white vaginal discharge. The discharge can be watery or foamy. A fish-like odor with discharge, especially after sex or during menstruation. Itching in and around the vagina. Burning or pain with urination. How is this diagnosed? This condition is diagnosed based on: Your medical history. A physical exam of the vagina. Checking a sample of vaginal fluid for harmful bacteria  or abnormal cells. How is this treated? This condition is treated with antibiotic medicines. These may be given as a pill, a vaginal cream, or a medicine that is put into the vagina (suppository). If the condition comes back after treatment, a second round of antibiotics may be needed. Follow these instructions at home: Medicines Take or apply over-the-counter and prescription medicines only as told by your health care provider. Take or apply your antibiotic medicine as told by your health care provider. Do not stop using the antibiotic even if you start to feel better. General instructions If you have a female sexual partner, tell her that you have a vaginal infection. She should follow up with her health care provider. If you have a female sexual partner, he does not need treatment. Avoid sexual activity until you finish treatment. Drink enough fluid to keep your urine pale yellow. Keep the area around your vagina and rectum clean. Wash the area daily with warm water. Wipe yourself from front to back after using the toilet. If you are breastfeeding, talk to your health care provider about continuing breastfeeding during treatment. Keep all follow-up visits. This is important. How is this prevented? Self-care Do not douche. Wash the outside of your vagina with warm water only. Wear cotton or cotton-lined underwear. Avoid wearing tight pants and pantyhose, especially during the summer. Safe sex Use protection when having sex. This includes: Using condoms. Using dental dams. This is a thin layer of a material made of latex or polyurethane that protects the mouth during oral sex. Limit the number of sexual partners. To help prevent bacterial vaginosis, it is best to have sex with just one  partner (monogamous relationship). Make sure you and your sexual partner are tested for STIs. Drugs and alcohol Do not use any products that contain nicotine or tobacco. These products include cigarettes,  chewing tobacco, and vaping devices, such as e-cigarettes. If you need help quitting, ask your health care provider. Do not use drugs. Do not drink alcohol if: Your health care provider tells you not to do this. You are pregnant, may be pregnant, or are planning to become pregnant. If you drink alcohol: Limit how much you have to 0-1 drink a day. Be aware of how much alcohol is in your drink. In the [place] one drink equals one 12 oz bottle of beer (355 mL), one 5 oz glass of wine (148 mL), or one 1 oz glass of hard liquor (44 mL). Where to find more information Centers for Disease Control and Prevention: http://www.wolf.info/ American Sexual Health Association (ASHA): www.ashastd.org U.S. Department of Health and Financial controller, Office on Women's Health: [place].tn Contact a health care provider if: Your symptoms do not improve, even after treatment. You have more discharge or pain when urinating. You have a fever or chills. You have pain in your abdomen or pelvis. You have pain during sex. You have vaginal bleeding between menstrual periods. Summary Bacterial vaginosis is a vaginal infection that occurs when the normal balance of bacteria in the vagina changes. It results from an overgrowth of certain bacteria. This condition increases the risk of sexually transmitted infections (STIs). Getting treated can help reduce this risk. Treatment is very important for pregnant women because this condition can cause babies to be born early (prematurely) or at low birth weight. This condition is treated with antibiotic medicines. These may be given as a pill, a vaginal cream, or a medicine that is put into the vagina (suppository). This information is not intended to replace advice given to you by your health care provider. Make sure you discuss any questions you have with your health care provider. Document Revised: 01/02/2020 Document Reviewed: 01/02/2020 Elsevier Patient Education  [place].  Vaginitis  Vaginitis is a condition in which the vaginal tissue swells and becomes irritated. This condition is most often caused by a change in the normal balance of bacteria and yeast that live in the vagina. This change causes an overgrowth of certain bacteria or yeast, which causes the inflammation. There are different types of vaginitis. What are the causes? The cause of this condition depends on the type of vaginitis. It can be caused by: Bacteria (bacterial vaginosis). Yeast, which is a fungus (candidiasis). A parasite (trichomoniasis vaginitis). A virus (viral vaginitis). Low hormone levels (atrophic vaginitis). Low hormone levels can occur during pregnancy, breastfeeding, or after menopause. Irritants, such as bubble baths, scented tampons, and feminine sprays (allergic vaginitis). Other factors can change the normal balance of the yeast and bacteria that live in the vagina. These include: Antibiotic medicines. Poor hygiene. Diaphragms, vaginal sponges, spermicides, birth control pills, and intrauterine devices (IUDs). Sex. Infection. Uncontrolled diabetes. A weakened body defense system (immune system). What increases the risk? This condition is more likely to develop in women who: Smoke or are exposed to secondhand smoke. Use vaginal douches, scented tampons, or scented sanitary pads. Wear tight-fitting pants or thong underwear. Use oral birth control pills or an IUD. Have sex without a condom or have multiple partners. Have an STI. Frequently use the spermicide nonoxynol-9. Eat lots of foods high in sugar or who have uncontrolled diabetes. Have low estrogen levels. Have a weakened  immune system from an immune disorder or medical treatment. Are pregnant or breastfeeding. What are the signs or symptoms? Symptoms vary depending on the cause of the vaginitis. Common symptoms include: Abnormal vaginal discharge. The discharge is white, gray, or yellow with bacterial  vaginosis. The discharge is thick, white, and cheesy with a yeast infection. The discharge is frothy and yellow or greenish with trichomoniasis. A bad vaginal smell. The smell is fishy with bacterial vaginosis. Vaginal itching, pain, or swelling. Pain with sex. Pain or burning when urinating. Sometimes there are no symptoms. How is this diagnosed? This condition is diagnosed based on your symptoms and medical history. A physical exam, including a pelvic exam, will also be done. You may also have other tests, including: Tests to determine the pH level (acidity or alkalinity) of your vagina. A whiff test to assess the odor that results when a sample of your vaginal discharge is mixed with a potassium hydroxide solution. Tests of vaginal fluid. A sample will be examined under a microscope. How is this treated? Treatment varies depending on the type of vaginitis you have. Your treatment may include: Antibiotic creams or pills to treat bacterial vaginosis and trichomoniasis. Antifungal medicines, such as vaginal creams or suppositories, to treat a yeast infection. Medicine to ease discomfort if you have viral vaginitis. Your sexual partner should also be treated. Estrogen delivered in a cream, pill, suppository, or vaginal ring to treat atrophic vaginitis. If vaginal dryness occurs, lubricants and moisturizing creams may help. You may need to avoid scented soaps, sprays, or douches. Stopping use of a product that is causing allergic vaginitis and then using a vaginal cream to treat the symptoms. Follow these instructions at home: Lifestyle Keep your genital area clean and dry. Avoid soap, and only rinse the area with water. Do not douche or use tampons until your health care provider says it is okay. Use sanitary pads, if needed. Do not have sex until your health care provider approves. When you can return to sex, practice safe sex and use condoms. Wipe from front to back. This avoids the spread  of bacteria from the rectum to the vagina. General instructions Take over-the-counter and prescription medicines only as told by your health care provider. If you were prescribed an antibiotic medicine, take or use it as told by your health care provider. Do not stop taking or using the antibiotic even if you start to feel better. Keep all follow-up visits. This is important. How is this prevented? Use mild, unscented products. Do not use things that can irritate the vagina, such as fabric softeners. Avoid the following products if they are scented: Feminine sprays. Detergents. Tampons. Feminine hygiene products. Soaps or bubble baths. Let air reach your genital area. To do this: Wear cotton underwear to reduce moisture buildup. Avoid wearing underwear while you sleep. Avoid wearing tight pants and underwear or nylons without a cotton panel. Avoid wearing thong underwear. Take off any wet clothing, such as bathing suits, as soon as possible. Practice safe sex and use condoms. Contact a health care provider if: You have abdominal or pelvic pain. You have a fever or chills. You have symptoms that last for more than 2-3 days. Get help right away if: You have a fever and your symptoms suddenly get worse. Summary Vaginitis is a condition in which the vaginal tissue becomes inflamed.This condition is most often caused by a change in the normal balance of bacteria and yeast that live in the vagina. Treatment varies depending on  the type of vaginitis you have. Do not douche, use tampons, or have sex until your health care provider approves. When you can return to sex, practice safe sex and use condoms. This information is not intended to replace advice given to you by your health care provider. Make sure you discuss any questions you have with your health care provider. Document Revised: 01/02/2020 Document Reviewed: 01/02/2020 Elsevier Patient Education  [place].  Pelvic Pain,  Female Pelvic pain is pain in your lower abdomen, below your belly button and between your hips. The pain may start suddenly (be acute), keep coming back (be recurring), or last a long time (become chronic). Pelvic pain that lasts longer than 6 months is considered chronic. Pelvic pain may affect your: Reproductive organs. Urinary system. Digestive tract. Musculoskeletal system. There are many potential causes of pelvic pain. Sometimes, the pain can be a result of digestive or urinary conditions, strained muscles or ligaments, or reproductive conditions. Sometimes the cause of pelvic pain is not known. Follow these instructions at home:  Take over-the-counter and prescription medicines only as told by your health care provider. Rest as told by your health care provider. Do not have sex if it hurts. Keep a journal of your pelvic pain. Write down: When the pain started. Where the pain is located. What seems to make the pain better or worse, such as food or your monthly period (menstrual cycle). Any symptoms you have along with the pain. Keep all follow-up visits. This is important. Contact a health care provider if: Medicine does not help your pain, or your pain comes back. You have new symptoms. You have abnormal vaginal discharge or bleeding, including bleeding after menopause. You have a fever or chills. You are constipated. You have blood in your urine or stool (feces). You have foul-smelling urine. You feel weak or light-headed. Get help right away if: You have sudden severe pain. Your pain gets steadily worse. You have severe pain along with fever, nausea, vomiting, or excessive sweating. You lose consciousness. These symptoms may represent a serious problem that is an emergency. Do not wait to see if the symptoms will go away. Get medical help right away. Call your local emergency services (911 in the [place].S[place] Do not drive yourself to the hospital. Summary Pelvic pain is pain in  your lower abdomen, below your belly button and between your hips. There are many potential causes of pelvic pain. Keep a journal of your pelvic pain. This information is not intended to replace advice given to you by your health care provider. Make sure you discuss any questions you have with your health care provider. Document Revised: 11/10/2020 Document Reviewed: 11/10/2020 Elsevier Patient Education  [place].

## 2022-03-24 NOTE — Progress Notes (Signed)
Chief Complaint  Patient presents with   Vaginal Discharge    Vaginal discharge for over a month and groin cramping pains for 2 weeks    F/u C/o vaginal discharge yellow and thick since 12/2021 having sex and vaginal spotting like on menses x 3 days though no cycle in almost 1 year and now x 2 weeks having lower ab cramping h/o fibroid will have pt f/u Dr. Marcelline Mates     Review of Systems  Gastrointestinal:  Positive for abdominal pain.  Genitourinary:        Discharge   Past Medical History:  Diagnosis Date   COVID-19    02/2021 paxlovid had   Fibroids    Genital herpes    Genital warts    Hyperlipidemia    Hypertension    Lymphocytosis    Migraine    Past Surgical History:  Procedure Laterality Date   COLONOSCOPY WITH PROPOFOL N/A 08/28/2018   Procedure: COLONOSCOPY WITH PROPOFOL;  Surgeon: Lin Landsman, MD;  Location: [facility] ENDOSCOPY;  Service: Gastroenterology;  Laterality: N/A;   ECTOPIC PREGNANCY SURGERY Left    EMBOLIZATION     For fibroid   ESOPHAGOGASTRODUODENOSCOPY (EGD) WITH PROPOFOL N/A 08/28/2018   Procedure: ESOPHAGOGASTRODUODENOSCOPY (EGD) WITH PROPOFOL;  Surgeon: Lin Landsman, MD;  Location: [place];  Service: Gastroenterology;  Laterality: N/A;   Family History  Problem Relation Age of Onset   Arthritis Mother    Diabetes Mother    Other Mother        brain tumor stroke like sx's   Diabetes Father    Cancer Brother    Breast cancer Maternal Aunt    Ovarian cancer Maternal Aunt    Aneurysm Maternal Grandmother    Cancer Maternal Grandfather    Colon cancer Neg Hx    Social History   Socioeconomic History   Marital status: Single    Spouse name: Not on file   Number of children: 1   Years of education: Not on file   Highest education level: Bachelor's degree (e.g., BA, AB, BS)  Occupational History   Not on file  Tobacco Use   Smoking status: Former    Types: Cigarettes    Quit date: 2007    Years since quitting: 16.6    Smokeless tobacco: Never  Vaping Use   Vaping Use: Never used  Substance and Sexual Activity   Alcohol use: Yes    Alcohol/week: 2.0 standard drinks of alcohol    Types: 2 Glasses of wine per week    Comment: per 2 weeks    Drug use: No   Sexual activity: Yes    Birth control/protection: Condom  Other Topics Concern   Not on file  Social History Narrative   1 daughter    Education officer, 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 into both eyes 3 (three) times daily. X 4-7 days   fluticasone (FLONASE) 50 MCG/ACT nasal spray Place 1-2 sprays into both nostrils daily.   hydrocortisone 2.5 % ointment Apply topically 2 (two) times daily. Prn ears   lisinopril-hydrochlorothiazide (ZESTORETIC) 10-12.5 MG tablet Take 1  tablet by mouth every morning.   loratadine (CLARITIN) 10 MG tablet Take 1 tablet (10 mg total) by mouth daily as needed for allergies.   methocarbamol (ROBAXIN) 500 MG tablet Take 1 tablet (500 mg total) by mouth at bedtime as needed for muscle spasms.   Olopatadine HCl (PATADAY) 0.7 % SOLN Apply 1 drop to eye daily. prn   pantoprazole (PROTONIX) 40 MG tablet TAKE 1 TABLET BY MOUTH ONCE DAILY (TAKE  30  MINUTES  BEFORE  A  MEAL)   pravastatin (PRAVACHOL) 10 MG tablet Take 1 tablet (10 mg total) by mouth daily. After 6pm   rizatriptan (MAXALT-MLT) 10 MG disintegrating tablet Take 1 tablet (10 mg total) by mouth as needed for migraine. May repeat in 2 hours if needed   Allergies  Allergen Reactions   Strawberry (Diagnostic) Hives, Itching and Swelling   Lipitor [Atorvastatin]     Muscle aches legs   Recent  Results (from the past 2160 hour(s))  CBC with Differential/Platelet     Status: None   Collection Time: 03/14/22 10:50 AM  Result Value Ref Range   WBC 5.6 4.0 - 10.5 K/uL   RBC 4.99 3.87 - 5.11 MIL/uL   Hemoglobin 13.7 12.0 - 15.0 g/dL   HCT 42.3 36.0 - 46.0 %   MCV 84.8 80.0 - 100.0 fL   MCH 27.5 26.0 - 34.0 pg   MCHC 32.4 30.0 - 36.0 g/dL   RDW 13.6 11.5 - 15.5 %   Platelets 186 150 - 400 K/uL   nRBC 0.0 0.0 - 0.2 %   Neutrophils Relative % 42 %   Neutro Abs 2.3 1.7 - 7.7 K/uL   Lymphocytes Relative 44 %   Lymphs Abs 2.5 0.7 - 4.0 K/uL   Monocytes Relative 10 %   Monocytes Absolute 0.6 0.1 - 1.0 K/uL   Eosinophils Relative 2 %   Eosinophils Absolute 0.1 0.0 - 0.5 K/uL   Basophils Relative 1 %   Basophils Absolute 0.1 0.0 - 0.1 K/uL   Immature Granulocytes 1 %   Abs Immature Granulocytes 0.03 0.00 - 0.07 K/uL    Comment: Performed at [facility], [address] [place], [place] [address]   Objective  Body mass index is 35.88 kg/m. Wt Readings from Last 3 Encounters:  03/24/22 236 lb (107 kg)  12/10/21 226 lb (102.5 kg)  08/18/21 230 lb (104.3 kg)   Temp Readings from Last 3 Encounters:  03/24/22 98 F (36.7 C) (Oral)  12/10/21 97.9 F (36.6 C) (Oral)  08/18/21 97.7 F (36.5 C) (Tympanic)   BP Readings from Last 3 Encounters:  03/24/22 118/82  12/10/21 110/70  08/18/21 115/80   Pulse Readings from Last 3 Encounters:  03/24/22 65  12/10/21 (!) 54  08/18/21 (!) 51    Physical Exam Vitals and nursing note reviewed.  Constitutional:      Appearance: Normal appearance. She is well-developed and well-groomed.  HENT:     Head: Normocephalic and atraumatic.  Eyes:     Conjunctiva/sclera: Conjunctivae normal.     Pupils: Pupils are equal, round, and reactive to light.  Cardiovascular:     Rate and Rhythm: Normal rate and regular rhythm.     Heart sounds: Normal heart sounds. No murmur heard. Pulmonary:     Effort: Pulmonary effort is normal.      Breath sounds: Normal breath sounds.  Abdominal:     General: Abdomen is flat. Bowel sounds are normal.     Tenderness: There is  no abdominal tenderness.  Genitourinary:    Pubic Area: No rash.      Labia:        Right: No rash.        Left: No rash.      Vagina: Normal.     Cervix: Discharge present.     Uterus: Normal.      Adnexa:        Right: Tenderness present.        Left: No tenderness.       Comments: Yellow thick discharge from cervix Musculoskeletal:        General: No tenderness.  Skin:    General: Skin is warm and dry.  Neurological:     General: No focal deficit present.     Mental Status: She is alert and oriented to person, place, and time. Mental status is at baseline.     Cranial Nerves: Cranial nerves 2-12 are intact.     Motor: Motor function is intact.     Coordination: Coordination is intact.     Gait: Gait is intact.  Psychiatric:        Attention and Perception: Attention and perception normal.        Mood and Affect: Mood and affect normal.        Speech: Speech normal.        Behavior: Behavior normal. Behavior is cooperative.        Thought Content: Thought content normal.        Cognition and Memory: Cognition and memory normal.        Judgment: Judgment normal.     Assessment  Plan  Acute vaginitis - Plan: Cervicovaginal ancillary only( [place]), Ambulatory referral to Obstetrics / Gynecology  Lower abdominal pain , pelvic- Plan: Urine Culture, Ambulatory referral to Obstetrics / Gynecology  Acute cystitis without hematuria - Plan: Urine Culture  Fibroids - Plan: Ambulatory referral to Obstetrics / Gynecology  Abdominal cramping - Plan: Ambulatory referral to Obstetrics / Gynecology   HM  See last note  Provider: Dr. Olivia Mackie McLean-Scocuzza-Internal Medicine

## 2022-03-25 LAB — URINE CULTURE
MICRO NUMBER:: 13885508
Result:: NO GROWTH
SPECIMEN QUALITY:: ADEQUATE

## 2022-03-28 LAB — CERVICOVAGINAL ANCILLARY ONLY
Bacterial Vaginitis (gardnerella): NEGATIVE
Candida Glabrata: NEGATIVE
Candida Vaginitis: NEGATIVE
Chlamydia: NEGATIVE
Comment: NEGATIVE
Comment: NEGATIVE
Comment: NEGATIVE
Comment: NEGATIVE
Comment: NEGATIVE
Comment: NORMAL
Neisseria Gonorrhea: NEGATIVE
Trichomonas: NEGATIVE

## 2022-04-19 ENCOUNTER — Ambulatory Visit: Payer: BC Managed Care – PPO | Admitting: Internal Medicine

## 2022-04-29 ENCOUNTER — Encounter: Payer: BC Managed Care – PPO | Admitting: Obstetrics and Gynecology

## 2022-04-29 NOTE — Progress Notes (Deleted)
ANNUAL PREVENTATIVE CARE GYNECOLOGY  ENCOUNTER NOTE  Subjective:       Nichole Rodriguez is a 56 y.o. G51P1011 female here for a routine annual gynecologic exam. The patient {is/is not/has never been:13135} sexually active. The patient {is/is not:13135} taking hormone replacement therapy. {post-men bleed:13152::"Patient denies post-menopausal vaginal bleeding."} The patient wears seatbelts: {yes/no:311178}. The patient participates in regular exercise: {yes/no/not asked:9010}. Has the patient ever been transfused or tattooed?: {yes/no/not asked:9010}. The patient reports that there {is/is not:9024} domestic violence in her life.  Current complaints: 1.  ***    Gynecologic History No LMP recorded. Patient is perimenopausal. Contraception: {method:5051} Last Pap: ***. Results were: {norm/abn:16337} Last mammogram: ***. Results were: {norm/abn:16337} Last Colonoscopy:  Last Dexa Scan:    Obstetric History OB History  Gravida Para Term Preterm AB Living  '2 1 1   1 1  '$ SAB IAB Ectopic Multiple Live Births      1   1    # Outcome Date GA Lbr Len/2nd Weight Sex Delivery Anes PTL Lv  2 Ectopic 1991          1 Term 1987   5 lb 4.8 oz (2.404 kg) F Vag-Spont   LIV    Past Medical History:  Diagnosis Date   COVID-19    02/2021 paxlovid had   Fibroids    Genital herpes    Genital warts    Hyperlipidemia    Hypertension    Lymphocytosis    Migraine     Family History  Problem Relation Age of Onset   Arthritis Mother    Diabetes Mother    Other Mother        brain tumor stroke like sx's   Diabetes Father    Cancer Brother    Breast cancer Maternal Aunt    Ovarian cancer Maternal Aunt    Aneurysm Maternal Grandmother    Cancer Maternal Grandfather    Colon cancer Neg Hx     Past Surgical History:  Procedure Laterality Date   COLONOSCOPY WITH PROPOFOL N/A 08/28/2018   Procedure: COLONOSCOPY WITH PROPOFOL;  Surgeon: Lin Landsman, MD;  Location: [facility];   Service: Gastroenterology;  Laterality: N/A;   ECTOPIC PREGNANCY SURGERY Left    EMBOLIZATION     For fibroid   ESOPHAGOGASTRODUODENOSCOPY (EGD) WITH PROPOFOL N/A 08/28/2018   Procedure: ESOPHAGOGASTRODUODENOSCOPY (EGD) WITH PROPOFOL;  Surgeon: Lin Landsman, MD;  Location: [place];  Service: Gastroenterology;  Laterality: N/A;    Social History   Socioeconomic History   Marital status: Single    Spouse name: Not on file   Number of children: 1   Years of education: Not on file   Highest education level: Bachelor's degree (e.g., BA, AB, BS)  Occupational History   Not on file  Tobacco Use   Smoking status: Former    Types: Cigarettes    Quit date: 2007    Years since quitting: 16.7   Smokeless tobacco: Never  Vaping Use   Vaping Use: Never used  Substance and Sexual Activity   Alcohol use: Yes    Alcohol/week: 2.0 standard drinks of alcohol    Types: 2 Glasses of wine per week    Comment: per 2 weeks    Drug use: No   Sexual activity: Yes    Birth control/protection: Condom  Other Topics Concern   Not on file  Social History Narrative   1 daughter    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 into both eyes 3 (three) times daily. X 4-7 days 3.5 g 0   fluticasone (FLONASE) 50 MCG/ACT nasal spray Place 1-2 sprays into both nostrils daily. 16 g 12   hydrocortisone 2.5 % ointment Apply topically 2 (two) times daily. Prn ears 60 g 11   lisinopril-hydrochlorothiazide (ZESTORETIC) 10-12.5 MG tablet Take 1 tablet by mouth every  morning. 90 tablet 3   loratadine (CLARITIN) 10 MG tablet Take 1 tablet (10 mg total) by mouth daily as needed for allergies. 90 tablet 3   methocarbamol (ROBAXIN) 500 MG tablet Take 1 tablet (500 mg total) by mouth at bedtime as needed for muscle spasms. 90 tablet 3   Olopatadine HCl (PATADAY) 0.7 % SOLN Apply 1 drop to eye daily. prn 2.5 mL 11   pantoprazole (PROTONIX) 40 MG tablet TAKE 1 TABLET BY MOUTH ONCE DAILY (TAKE  30  MINUTES  BEFORE  A  MEAL) 90 tablet 3   pravastatin (PRAVACHOL) 10 MG tablet Take 1 tablet (10 mg total) by mouth daily. After 6pm 90 tablet 3   rizatriptan (MAXALT-MLT) 10 MG disintegrating tablet Take 1 tablet (10 mg total) by mouth as needed for migraine. May repeat in 2 hours if needed 9 tablet 11   Semaglutide-Weight Management (WEGOVY) 0.25 MG/0.5ML SOAJ Inject 0.25 mg into the skin once a week. (Patient not taking: Reported on 03/24/2022) 2 mL 0   Semaglutide-Weight Management (WEGOVY) 0.5 MG/0.5ML SOAJ Inject 0.5 mg into the skin once a week. (Patient not taking: Reported on 03/24/2022) 2 mL 0   Semaglutide-Weight Management (WEGOVY) 1 MG/0.5ML SOAJ Inject 1 mg into the skin once a week. (Patient not taking: Reported on 03/24/2022) 2 mL 0   Semaglutide-Weight Management (WEGOVY) 1.7 MG/0.75ML SOAJ Inject 1.7 mg into the skin once a week. (Patient not taking: Reported on 03/24/2022) 3 mL 0   Semaglutide-Weight Management (WEGOVY) 2.4 MG/0.75ML SOAJ Inject 2.4 mg into the skin once a week. (Patient not taking: Reported on 03/24/2022) 3 mL 5   valACYclovir (VALTREX) 500 MG tablet Take 1 tablet (500 mg total) by mouth daily. For prevention bid x 3-7 days outbreak (Patient not taking: Reported on 03/24/2022) 120 tablet 3   VITAMIN D PO Take by mouth. (Patient not taking: Reported on 03/24/2022)     No current facility-administered medications on file prior to visit.    Allergies  Allergen Reactions   Strawberry (Diagnostic) Hives, Itching and Swelling   Lipitor [Atorvastatin]      Muscle aches legs      Review of Systems ROS Review of Systems - General ROS: negative for - chills, fatigue, fever, hot flashes, night sweats, weight gain or weight loss Psychological ROS: negative for - anxiety, decreased libido, depression, mood swings, physical abuse or sexual abuse Ophthalmic ROS: negative for - blurry vision, eye pain or loss of vision ENT ROS: negative for - headaches, hearing change, visual changes or vocal changes Allergy and Immunology ROS: negative for - hives, itchy/watery eyes or seasonal allergies Hematological and Lymphatic ROS: negative for - bleeding problems, bruising, swollen lymph nodes  or weight loss Endocrine ROS: negative for - galactorrhea, hair pattern changes, hot flashes, malaise/lethargy, mood swings, palpitations, polydipsia/polyuria, skin changes, temperature intolerance or unexpected weight changes Breast ROS: negative for - new or changing breast lumps or nipple discharge Respiratory ROS: negative for - cough or shortness of breath Cardiovascular ROS: negative for - chest pain, irregular heartbeat, palpitations or shortness of breath Gastrointestinal ROS: no abdominal pain, change in bowel habits, or black or bloody stools Genito-Urinary ROS: no dysuria, trouble voiding, or hematuria Musculoskeletal ROS: negative for - joint pain or joint stiffness Neurological ROS: negative for - bowel and bladder control changes Dermatological ROS: negative for rash and skin lesion changes   Objective:   There were no vitals taken for this visit. CONSTITUTIONAL: Well-developed, well-nourished female in no acute distress.  PSYCHIATRIC: Normal mood and affect. Normal behavior. Normal judgment and thought content. [place]: Alert and oriented to person, place, and time. Normal muscle tone coordination. No cranial nerve deficit noted. HENT:  Normocephalic, atraumatic, External right and left ear normal. Oropharynx is clear and moist EYES: Conjunctivae and  EOM are normal. Pupils are equal, round, and reactive to light. No scleral icterus.  NECK: Normal range of motion, supple, no masses.  Normal thyroid.  SKIN: Skin is warm and dry. No rash noted. Not diaphoretic. No erythema. No pallor. CARDIOVASCULAR: Normal heart rate noted, regular rhythm, no murmur. RESPIRATORY: Clear to auscultation bilaterally. Effort and breath sounds normal, no problems with respiration noted. BREASTS: Symmetric in size. No masses, skin changes, nipple drainage, or lymphadenopathy. ABDOMEN: Soft, normal bowel sounds, no distention noted.  No tenderness, rebound or guarding.  BLADDER: Normal PELVIC:  Bladder {:311640}  Urethra: {:311719}  Vulva: {:311722}  Vagina: {:311643}  Cervix: {:311644}  Uterus: {:311718}  Adnexa: {:311645}  RV: {Blank multiple:19196::"External Exam NormaI","No Rectal Masses","Normal Sphincter tone"}  MUSCULOSKELETAL: Normal range of motion. No tenderness.  No cyanosis, clubbing, or edema.  2+ distal pulses. LYMPHATIC: No Axillary, Supraclavicular, or Inguinal Adenopathy.   Labs: Lab Results  Component Value Date   WBC 5.6 03/14/2022   HGB 13.7 03/14/2022   HCT 42.3 03/14/2022   MCV 84.8 03/14/2022   PLT 186 03/14/2022    Lab Results  Component Value Date   CREATININE 1.10 12/10/2021   BUN 15 12/10/2021   NA 139 12/10/2021   K 4.0 12/10/2021   CL 103 12/10/2021   CO2 27 12/10/2021    Lab Results  Component Value Date   ALT 34 07/30/2021   AST 31 07/30/2021   ALKPHOS 60 07/30/2021   BILITOT 0.5 07/30/2021    Lab Results  Component Value Date   CHOL 202 (H) 12/10/2021   HDL 83.30 12/10/2021   LDLCALC 107 (H) 12/10/2021   TRIG 58.0 12/10/2021   CHOLHDL 2 12/10/2021    Lab Results  Component Value Date   TSH 2.95 09/29/2020    Lab Results  Component Value Date   HGBA1C 6.3 07/30/2021     Assessment:   No diagnosis found.   Plan:  Pap: {Blank multiple:19196::"Pap, Reflex if ASCUS","Pap Co Test","GC/CT  NAAT","Not needed","Not done"} Mammogram: {Blank multiple:19196::"***","Ordered","Not Ordered","Not Indicated"} Colon Screening:  {Blank multiple:19196::"***","Ordered","Not Ordered","Not Indicated"} Labs: {Blank multiple:19196::"Lipid 1","FBS","TSH","Hemoglobin A1C","Vit D Level""***"} Routine preventative health maintenance measures emphasized: {Blank multiple:19196::"Exercise/Diet/Weight control","Tobacco Warnings","Alcohol/Substance use risks","Stress Management","Peer Pressure Issues","Safe Sex"} COVID Vaccination status: Return to [place], [place]

## 2022-05-04 ENCOUNTER — Telehealth: Payer: Self-pay | Admitting: Obstetrics and Gynecology

## 2022-05-04 ENCOUNTER — Encounter: Payer: Self-pay | Admitting: Obstetrics and Gynecology

## 2022-05-04 NOTE — Telephone Encounter (Signed)
Contacted patient, No answer, Left VM for patient to contact the office for rescheduling of missed appointment.

## 2022-06-06 ENCOUNTER — Ambulatory Visit (INDEPENDENT_AMBULATORY_CARE_PROVIDER_SITE_OTHER): Payer: Self-pay | Admitting: Family

## 2022-06-06 ENCOUNTER — Encounter: Payer: Self-pay | Admitting: Family

## 2022-06-06 VITALS — BP 118/78 | HR 65 | Temp 97.6°F | Ht 69.0 in | Wt 235.6 lb

## 2022-06-06 DIAGNOSIS — I10 Essential (primary) hypertension: Secondary | ICD-10-CM

## 2022-06-06 DIAGNOSIS — R7303 Prediabetes: Secondary | ICD-10-CM

## 2022-06-06 DIAGNOSIS — Z0000 Encounter for general adult medical examination without abnormal findings: Secondary | ICD-10-CM

## 2022-06-06 DIAGNOSIS — E785 Hyperlipidemia, unspecified: Secondary | ICD-10-CM

## 2022-06-06 DIAGNOSIS — E559 Vitamin D deficiency, unspecified: Secondary | ICD-10-CM

## 2022-06-06 NOTE — Assessment & Plan Note (Addendum)
Patient will schedule mammogram.  Encouraged self breast exam.  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

## 2022-06-06 NOTE — Assessment & Plan Note (Signed)
Excellent control.  Continue lisinopril-hydrochlorothiazide 10-12.5 mg

## 2022-06-06 NOTE — Patient Instructions (Addendum)
Please call  and schedule your 3D mammogram and /or bone density scan as we discussed.   [facility]  ( new location in 2023)  [address], [place], [place] [address]  [place], [place]  [phone]   Health Maintenance for Postmenopausal Women Menopause is a normal process in which your ability to get pregnant comes to an end. This process happens slowly over many months or years, usually between the ages of 48 and 55. Menopause is complete when you have missed your menstrual period for 12 months. It is important to talk with your health care provider about some of the most common conditions that affect women after menopause (postmenopausal women). These include heart disease, cancer, and bone loss (osteoporosis). Adopting a healthy lifestyle and getting preventive care can help to promote your health and wellness. The actions you take can also lower your chances of developing some of these common conditions. What are the signs and symptoms of menopause? During menopause, you may have the following symptoms: Hot flashes. These can be moderate or severe. Night sweats. Decrease in sex drive. Mood swings. Headaches. Tiredness (fatigue). Irritability. Memory problems. Problems falling asleep or staying asleep. Talk with your health care provider about treatment options for your symptoms. Do I need hormone replacement therapy? Hormone replacement therapy is effective in treating symptoms that are caused by menopause, such as hot flashes and night sweats. Hormone replacement carries certain risks, especially as you become older. If you are thinking about using estrogen or estrogen with progestin, discuss the benefits and risks with your health care provider. How can I reduce my risk for heart disease and stroke? The risk of heart disease, heart attack, and stroke increases as you age. One of the causes may be a change in the body's hormones during menopause. This can  affect how your body uses dietary fats, triglycerides, and cholesterol. Heart attack and stroke are medical emergencies. There are many things that you can do to help prevent heart disease and stroke. Watch your blood pressure High blood pressure causes heart disease and increases the risk of stroke. This is more likely to develop in people who have high blood pressure readings or are overweight. Have your blood pressure checked: Every 3-5 years if you are 18-39 years of age. Every year if you are 40 years old or older. Eat a healthy diet  Eat a diet that includes plenty of vegetables, fruits, low-fat dairy products, and lean protein. Do not eat a lot of foods that are high in solid fats, added sugars, or sodium. Get regular exercise Get regular exercise. This is one of the most important things you can do for your health. Most adults should: Try to exercise for at least 150 minutes each week. The exercise should increase your heart rate and make you sweat (moderate-intensity exercise). Try to do strengthening exercises at least twice each week. Do these in addition to the moderate-intensity exercise. Spend less time sitting. Even light physical activity can be beneficial. Other tips Work with your health care provider to achieve or maintain a healthy weight. Do not use any products that contain nicotine or tobacco. These products include cigarettes, chewing tobacco, and vaping devices, such as e-cigarettes. If you need help quitting, ask your health care provider. Know your numbers. Ask your health care provider to check your cholesterol and your blood sugar (glucose). Continue to have your blood tested as directed by your health care provider. Do I need screening for cancer? Depending   on your health history and family history, you may need to have cancer screenings at different stages of your life. This may include screening for: Breast cancer. Cervical cancer. Lung cancer. Colorectal  cancer. What is my risk for osteoporosis? After menopause, you may be at increased risk for osteoporosis. Osteoporosis is a condition in which bone destruction happens more quickly than new bone creation. To help prevent osteoporosis or the bone fractures that can happen because of osteoporosis, you may take the following actions: If you are 19-50 years old, get at least 1,000 mg of calcium and at least 600 international units (IU) of vitamin D per day. If you are older than age 50 but younger than age 70, get at least 1,200 mg of calcium and at least 600 international units (IU) of vitamin D per day. If you are older than age 70, get at least 1,200 mg of calcium and at least 800 international units (IU) of vitamin D per day. Smoking and drinking excessive alcohol increase the risk of osteoporosis. Eat foods that are rich in calcium and vitamin D, and do weight-bearing exercises several times each week as directed by your health care provider. How does menopause affect my mental health? Depression may occur at any age, but it is more common as you become older. Common symptoms of depression include: Feeling depressed. Changes in sleep patterns. Changes in appetite or eating patterns. Feeling an overall lack of motivation or enjoyment of activities that you previously enjoyed. Frequent crying spells. Talk with your health care provider if you think that you are experiencing any of these symptoms. General instructions See your health care provider for regular wellness exams and vaccines. This may include: Scheduling regular health, dental, and eye exams. Getting and maintaining your vaccines. These include: Influenza vaccine. Get this vaccine each year before the flu season begins. Pneumonia vaccine. Shingles vaccine. Tetanus, diphtheria, and pertussis (Tdap) booster vaccine. Your health care provider may also recommend other immunizations. Tell your health care provider if you have ever been  abused or do not feel safe at home. Summary Menopause is a normal process in which your ability to get pregnant comes to an end. This condition causes hot flashes, night sweats, decreased interest in sex, mood swings, headaches, or lack of sleep. Treatment for this condition may include hormone replacement therapy. Take actions to keep yourself healthy, including exercising regularly, eating a healthy diet, watching your weight, and checking your blood pressure and blood sugar levels. Get screened for cancer and depression. Make sure that you are up to date with all your vaccines. This information is not intended to replace advice given to you by your health care provider. Make sure you discuss any questions you have with your health care provider. Document Revised: 11/23/2020 Document Reviewed: 11/23/2020 Elsevier Patient Education  2023 [employer]  

## 2022-06-06 NOTE — Progress Notes (Signed)
Subjective:    Patient ID: Nichole Rodriguez, female    DOB: 01-06-1966, 55 y.o.   MRN: [id number]  CC: Nichole Rodriguez is a 56 y.o. female who presents today for physical exam.    HPI: Feels well today.  No new complaints.  Recently started at job at [employer] and has physical paperwork to be completed for health examination.  Screenings are required for tuberculosis vaccine history. 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   No cough, fever, chills, unintentional weight loss   H/o HTN,atherosclerosis   She is compliant with pravastatin 10 mg.  She remains compliant lisinopril-hydrochlorothiazide 10-12.5 mg.  Colorectal Cancer Screening: UTD , Dr Marius Ditch 08/28/18, repeat in 10 years.   Breast Cancer Screening: Mammogram overdue Cervical Cancer Screening: UTD, 01/26/21, negative HPV, malignancy  Bone Health screening/DEXA for 65+: No increased fracture risk. Defer screening at this time.  Lung Cancer Screening: Doesn't have 20 year pack year history .        Tetanus - UTD         Labs: Screening labs today. Exercise: Gets regular exercise.   Alcohol use:  occassional Smoking/tobacco use: former smoker, quit 16 years ago     HISTORY:  Past Medical History:  Diagnosis Date   COVID-19    02/2021 paxlovid had   Fibroids    Genital herpes    Genital warts    Hyperlipidemia    Hypertension    Lymphocytosis    Migraine     Past Surgical History:  Procedure Laterality Date   COLONOSCOPY WITH PROPOFOL N/A 08/28/2018   Procedure: COLONOSCOPY WITH PROPOFOL;  Surgeon: Lin Landsman, MD;  Location: [facility] ENDOSCOPY;  Service: Gastroenterology;  Laterality: N/A;   ECTOPIC PREGNANCY SURGERY Left    EMBOLIZATION     For fibroid   ESOPHAGOGASTRODUODENOSCOPY (EGD) WITH PROPOFOL N/A 08/28/2018   Procedure: ESOPHAGOGASTRODUODENOSCOPY (EGD) WITH PROPOFOL;  Surgeon: Lin Landsman, MD;  Location: [place];  Service: Gastroenterology;   Laterality: N/A;   Family History  Problem Relation Age of Onset   Arthritis Mother    Diabetes Mother    Other Mother        brain tumor stroke like sx's   Diabetes Father    Cancer Brother    Breast cancer Maternal Aunt    Ovarian cancer Maternal Aunt    Aneurysm Maternal Grandmother    Cancer Maternal Grandfather    Colon cancer Neg Hx       ALLERGIES: Strawberry (diagnostic) and Lipitor [atorvastatin]  Current Outpatient Medications on File Prior to Visit  Medication Sig Dispense Refill   erythromycin ophthalmic ointment Place 1 application. into both eyes 3 (three) times daily. X 4-7 days 3.5 g 0   fluticasone (FLONASE) 50 MCG/ACT nasal spray Place 1-2 sprays into both nostrils daily. 16 g 12   hydrocortisone 2.5 % ointment Apply topically 2 (two) times daily. Prn ears 60 g 11   lisinopril-hydrochlorothiazide (ZESTORETIC) 10-12.5 MG tablet Take 1 tablet by mouth every morning. 90 tablet 3   loratadine (CLARITIN) 10 MG tablet Take 1 tablet (10 mg total) by mouth daily as needed for allergies. 90 tablet 3   methocarbamol (ROBAXIN) 500 MG tablet Take 1 tablet (500 mg total) by mouth at bedtime as needed for muscle spasms. 90 tablet 3   Olopatadine HCl (PATADAY) 0.7 % SOLN Apply 1 drop to eye daily. prn 2.5 mL 11   pantoprazole (PROTONIX) 40 MG tablet TAKE  1 TABLET BY MOUTH ONCE DAILY (TAKE  30  MINUTES  BEFORE  A  MEAL) 90 tablet 3   pravastatin (PRAVACHOL) 10 MG tablet Take 1 tablet (10 mg total) by mouth daily. After 6pm 90 tablet 3   rizatriptan (MAXALT-MLT) 10 MG disintegrating tablet Take 1 tablet (10 mg total) by mouth as needed for migraine. May repeat in 2 hours if needed 9 tablet 11   valACYclovir (VALTREX) 500 MG tablet Take 1 tablet (500 mg total) by mouth daily. For prevention bid x 3-7 days outbreak (Patient not taking: Reported on 03/24/2022) 120 tablet 3   VITAMIN D PO Take by mouth. (Patient not taking: Reported on 03/24/2022)     No current facility-administered  medications on file prior to visit.    Social History   Tobacco Use   Smoking status: Former    Types: Cigarettes    Quit date: 2007    Years since quitting: 16.8   Smokeless tobacco: Never  Vaping Use   Vaping Use: Never used  Substance Use Topics   Alcohol use: Yes    Alcohol/week: 2.0 standard drinks of alcohol    Types: 2 Glasses of wine per week    Comment: per 2 weeks    Drug use: No    Review of Systems  Constitutional:  Negative for chills, fever and unexpected weight change.  HENT:  Negative for congestion.   Respiratory:  Negative for cough.   Cardiovascular:  Negative for chest pain, palpitations and leg swelling.  Gastrointestinal:  Negative for nausea and vomiting.  Musculoskeletal:  Negative for arthralgias and myalgias.  Skin:  Negative for rash.  Neurological:  Negative for headaches.  Hematological:  Negative for adenopathy.  Psychiatric/Behavioral:  Negative for confusion.       Objective:    BP 118/78 (BP Location: Left Arm, Patient Position: Sitting, Cuff Size: Normal)   Pulse 65   Temp 97.6 F (36.4 C) (Oral)   Ht _0  (1.753 m)   Wt 235 lb 9.6 oz (106.9 kg)   LMP 05/18/2022   SpO2 99%   BMI 34.79 kg/m   BP Readings from Last 3 Encounters:  06/06/22 118/78  03/24/22 118/82  12/10/21 110/70   Wt Readings from Last 3 Encounters:  06/06/22 235 lb 9.6 oz (106.9 kg)  03/24/22 236 lb (107 kg)  12/10/21 226 lb (102.5 kg)    Physical Exam Vitals reviewed.  Constitutional:      Appearance: Normal appearance. She is well-developed.  Eyes:     Conjunctiva/sclera: Conjunctivae normal.  Neck:     Thyroid: No thyroid mass or thyromegaly.  Cardiovascular:     Rate and Rhythm: Normal rate and regular rhythm.     Pulses: Normal pulses.     Heart sounds: Normal heart sounds.  Pulmonary:     Effort: Pulmonary effort is normal.     Breath sounds: Normal breath sounds. No wheezing, rhonchi or rales.  Chest:  Breasts:    Breasts are  symmetrical.     Right: No inverted nipple, mass, nipple discharge, skin change or tenderness.     Left: No inverted nipple, mass, nipple discharge, skin change or tenderness.  Abdominal:     General: Bowel sounds are normal. There is no distension.     Palpations: Abdomen is soft. Abdomen is not rigid. There is no fluid wave or mass.     Tenderness: There is no abdominal tenderness. There is no guarding or rebound.  Lymphadenopathy:  Head:     Right side of head: No submental, submandibular, tonsillar, preauricular, posterior auricular or occipital adenopathy.     Left side of head: No submental, submandibular, tonsillar, preauricular, posterior auricular or occipital adenopathy.     Cervical: No cervical adenopathy.     Right cervical: No superficial, deep or posterior cervical adenopathy.    Left cervical: No superficial, deep or posterior cervical adenopathy.  Skin:    General: Skin is warm and dry.  Neurological:     Mental Status: She is alert.  Psychiatric:        Speech: Speech normal.        Behavior: Behavior normal.        Thought Content: Thought content normal.        Assessment & Plan:   Problem List Items Addressed This Visit       Cardiovascular and Mediastinum   Essential hypertension    Excellent control.  Continue lisinopril-hydrochlorothiazide 10-12.5 mg      Relevant Orders   CBC with Differential/Platelet   Comprehensive metabolic panel   TSH     Other   Annual physical exam - Primary    Patient will schedule mammogram.  Encouraged self breast exam.  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       Relevant Orders   QuantiFERON-TB Gold Plus   Measles/Mumps/Rubella Immunity   Hyperlipidemia   Relevant Orders   Hemoglobin A1c   Lipid panel   Prediabetes   Relevant Orders   Hemoglobin A1c   Vitamin D deficiency   Relevant Orders   VITAMIN D  25 Hydroxy (Vit-D Deficiency, Fractures)     I have discontinued [facility] [place], [place], [place], [place], and [phone]. I am also having her maintain her hydrocortisone, VITAMIN D PO, pravastatin, valACYclovir, methocarbamol, fluticasone, pantoprazole, erythromycin, Pataday, lisinopril-hydrochlorothiazide, loratadine, and rizatriptan.   No orders of the defined types were placed in this encounter.   Return precautions given.   Risks, benefits, and alternatives of the medications and treatment plan prescribed today were discussed, and patient expressed understanding.   Education regarding symptom management and diagnosis given to patient on AVS.   Continue to follow with McLean-Scocuzza, Nino Glow, MD for routine health maintenance.   Nichole Rodriguez and I agreed with plan.   Mable Paris, FNP

## 2022-09-09 ENCOUNTER — Other Ambulatory Visit: Payer: Self-pay

## 2022-09-09 DIAGNOSIS — D708 Other neutropenia: Secondary | ICD-10-CM

## 2022-09-12 ENCOUNTER — Inpatient Hospital Stay: Payer: Self-pay | Admitting: Oncology

## 2022-09-12 ENCOUNTER — Telehealth: Payer: Self-pay | Admitting: *Deleted

## 2022-09-12 ENCOUNTER — Other Ambulatory Visit (INDEPENDENT_AMBULATORY_CARE_PROVIDER_SITE_OTHER): Payer: BC Managed Care – PPO

## 2022-09-12 ENCOUNTER — Inpatient Hospital Stay: Payer: Self-pay

## 2022-09-12 DIAGNOSIS — R7303 Prediabetes: Secondary | ICD-10-CM | POA: Diagnosis not present

## 2022-09-12 DIAGNOSIS — I10 Essential (primary) hypertension: Secondary | ICD-10-CM | POA: Diagnosis not present

## 2022-09-12 DIAGNOSIS — E785 Hyperlipidemia, unspecified: Secondary | ICD-10-CM

## 2022-09-12 DIAGNOSIS — E559 Vitamin D deficiency, unspecified: Secondary | ICD-10-CM | POA: Diagnosis not present

## 2022-09-12 LAB — LIPID PANEL
Cholesterol: 240 mg/dL — ABNORMAL HIGH (ref 0–200)
HDL: 73.4 mg/dL (ref >39.00)
LDL Cholesterol: 149 mg/dL — ABNORMAL HIGH (ref 0–99)
NonHDL: 166.35
Total CHOL/HDL Ratio: 3
Triglycerides: 86 mg/dL (ref 0.0–149.0)
VLDL: 17.2 mg/dL (ref 0.0–40.0)

## 2022-09-12 LAB — COMPREHENSIVE METABOLIC PANEL
ALT: 29 U/L (ref 0–35)
AST: 30 U/L (ref 0–37)
Albumin: 4.4 g/dL (ref 3.5–5.2)
Alkaline Phosphatase: 64 U/L (ref 39–117)
BUN: 9 mg/dL (ref 6–23)
CO2: 30 mEq/L (ref 19–32)
Calcium: 10.1 mg/dL (ref 8.4–10.5)
Chloride: 100 mEq/L (ref 96–112)
Creatinine, Ser: 0.92 mg/dL (ref 0.40–1.20)
GFR: 70.72 mL/min (ref >60.00)
Glucose, Bld: 78 mg/dL (ref 70–99)
Potassium: 4.1 mEq/L (ref 3.5–5.1)
Sodium: 141 mEq/L (ref 135–145)
Total Bilirubin: 0.4 mg/dL (ref 0.2–1.2)
Total Protein: 7.4 g/dL (ref 6.0–8.3)

## 2022-09-12 LAB — CBC WITH DIFFERENTIAL/PLATELET
Basophils Absolute: 0.1 10*3/uL (ref 0.0–0.1)
Basophils Relative: 1.1 % (ref 0.0–3.0)
Eosinophils Absolute: 0.1 10*3/uL (ref 0.0–0.7)
Eosinophils Relative: 1.5 % (ref 0.0–5.0)
HCT: 42.5 % (ref 36.0–46.0)
Hemoglobin: 13.9 g/dL (ref 12.0–15.0)
Lymphocytes Relative: 47 % — ABNORMAL HIGH (ref 12.0–46.0)
Lymphs Abs: 2.6 10*3/uL (ref 0.7–4.0)
MCHC: 32.8 g/dL (ref 30.0–36.0)
MCV: 82.2 fl (ref 78.0–100.0)
Monocytes Absolute: 0.5 10*3/uL (ref 0.1–1.0)
Monocytes Relative: 8.8 % (ref 3.0–12.0)
Neutro Abs: 2.3 10*3/uL (ref 1.4–7.7)
Neutrophils Relative %: 41.6 % — ABNORMAL LOW (ref 43.0–77.0)
Platelets: 207 10*3/uL (ref 150.0–400.0)
RBC: 5.17 Mil/uL — ABNORMAL HIGH (ref 3.87–5.11)
RDW: 13.6 % (ref 11.5–15.5)
WBC: 5.6 10*3/uL (ref 4.0–10.5)

## 2022-09-12 LAB — TSH: TSH: 2.91 u[IU]/mL (ref 0.35–5.50)

## 2022-09-12 LAB — HEMOGLOBIN A1C: Hgb A1c MFr Bld: 6.3 % (ref 4.6–6.5)

## 2022-09-12 LAB — VITAMIN D 25 HYDROXY (VIT D DEFICIENCY, FRACTURES): VITD: 27.9 ng/mL — ABNORMAL LOW (ref 30.00–100.00)

## 2022-09-12 NOTE — Telephone Encounter (Signed)
Pt called and wanted to see if she cna get labs at her PCP and not have to get the cbc/d with [place] on 2/26. I called [facility] and she is getting labs including cbc/d today. So I cancelled the lab encounter for 2/27 but pt will still come to the appt with Janese Banks tom. She is agreeable

## 2022-09-13 ENCOUNTER — Telehealth: Payer: Self-pay | Admitting: Family

## 2022-09-13 ENCOUNTER — Other Ambulatory Visit: Payer: Self-pay | Admitting: Family

## 2022-09-13 ENCOUNTER — Inpatient Hospital Stay: Payer: BC Managed Care – PPO | Attending: Oncology | Admitting: Oncology

## 2022-09-13 ENCOUNTER — Other Ambulatory Visit: Payer: Self-pay

## 2022-09-13 ENCOUNTER — Encounter: Payer: Self-pay | Admitting: Oncology

## 2022-09-13 VITALS — BP 116/86 | HR 76 | Temp 96.9°F | Resp 18 | Ht 68.0 in | Wt 239.0 lb

## 2022-09-13 DIAGNOSIS — E785 Hyperlipidemia, unspecified: Secondary | ICD-10-CM | POA: Diagnosis not present

## 2022-09-13 DIAGNOSIS — Z79899 Other long term (current) drug therapy: Secondary | ICD-10-CM | POA: Insufficient documentation

## 2022-09-13 DIAGNOSIS — Z789 Other specified health status: Secondary | ICD-10-CM

## 2022-09-13 DIAGNOSIS — Z8041 Family history of malignant neoplasm of ovary: Secondary | ICD-10-CM | POA: Insufficient documentation

## 2022-09-13 DIAGNOSIS — Z87891 Personal history of nicotine dependence: Secondary | ICD-10-CM | POA: Diagnosis not present

## 2022-09-13 DIAGNOSIS — Z111 Encounter for screening for respiratory tuberculosis: Secondary | ICD-10-CM

## 2022-09-13 DIAGNOSIS — D709 Neutropenia, unspecified: Secondary | ICD-10-CM | POA: Insufficient documentation

## 2022-09-13 DIAGNOSIS — I10 Essential (primary) hypertension: Secondary | ICD-10-CM | POA: Insufficient documentation

## 2022-09-13 DIAGNOSIS — Z803 Family history of malignant neoplasm of breast: Secondary | ICD-10-CM | POA: Diagnosis not present

## 2022-09-13 DIAGNOSIS — R899 Unspecified abnormal finding in specimens from other organs, systems and tissues: Secondary | ICD-10-CM

## 2022-09-13 NOTE — Telephone Encounter (Signed)
Patient was called and she would like to do the labs for the MMR and the lab test for TB, I have scheduled her for tomorrow at 9:45 am I placed the order please look at it and make sure I ordered it correctly and I will bring you her immunization printout.  Antoria Lanza,cma

## 2022-09-13 NOTE — Telephone Encounter (Signed)
Call patient  Please print vaccine list in chart and also if there are vaccine in [facility]    I have form from  [employer].  It appears in chart that tetanus and hepatitis B series are up-to-date.    I do not have any record of MMR.  Would she like to have a titer ordered?   Do not see that she has had a tuberculosis screening this year?  Would she prefer to do the skin test or the lab test?  Will hold paperwork until above completed

## 2022-09-13 NOTE — Progress Notes (Unsigned)
Patient states that she is changing providers and out cholesterol medication, she states that it may show high in labs.

## 2022-09-14 ENCOUNTER — Other Ambulatory Visit (INDEPENDENT_AMBULATORY_CARE_PROVIDER_SITE_OTHER): Payer: BC Managed Care – PPO

## 2022-09-14 DIAGNOSIS — Z789 Other specified health status: Secondary | ICD-10-CM

## 2022-09-14 DIAGNOSIS — Z111 Encounter for screening for respiratory tuberculosis: Secondary | ICD-10-CM

## 2022-09-14 DIAGNOSIS — R899 Unspecified abnormal finding in specimens from other organs, systems and tissues: Secondary | ICD-10-CM | POA: Diagnosis not present

## 2022-09-14 LAB — CBC WITH DIFFERENTIAL/PLATELET
Basophils Absolute: 0 10*3/uL (ref 0.0–0.1)
Basophils Relative: 0.7 % (ref 0.0–3.0)
Eosinophils Absolute: 0.1 10*3/uL (ref 0.0–0.7)
Eosinophils Relative: 1.7 % (ref 0.0–5.0)
HCT: 41.4 % (ref 36.0–46.0)
Hemoglobin: 13.5 g/dL (ref 12.0–15.0)
Lymphocytes Relative: 42.3 % (ref 12.0–46.0)
Lymphs Abs: 2.8 10*3/uL (ref 0.7–4.0)
MCHC: 32.5 g/dL (ref 30.0–36.0)
MCV: 82.6 fl (ref 78.0–100.0)
Monocytes Absolute: 0.6 10*3/uL (ref 0.1–1.0)
Monocytes Relative: 9.3 % (ref 3.0–12.0)
Neutro Abs: 3 10*3/uL (ref 1.4–7.7)
Neutrophils Relative %: 46 % (ref 43.0–77.0)
Platelets: 217 10*3/uL (ref 150.0–400.0)
RBC: 5.02 Mil/uL (ref 3.87–5.11)
RDW: 13.7 % (ref 11.5–15.5)
WBC: 6.6 10*3/uL (ref 4.0–10.5)

## 2022-09-14 NOTE — Telephone Encounter (Signed)
Spoke to pt and she stated that she needs to wait until labs are resulted to come pick up paperwork. I told her I would call haer as soon as labs were resulted and she could come pick up paperwork.

## 2022-09-14 NOTE — Telephone Encounter (Signed)
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 As per pt, she would like a phone call when the paperwork are ready.Marland Kitchen

## 2022-09-15 NOTE — Progress Notes (Signed)
Hematology/Oncology Consult note [facility]  Telephone:([phone] [phone]  Patient Care Team: Carollee Leitz, MD as PCP - General (Family Medicine) Sindy Guadeloupe, MD as Consulting Physician (Hematology and Oncology)   Name of the patient: Nichole Rodriguez  QR:[id number]  April 07, 1966   Date of visit: 09/15/22  Diagnosis-neutropenia likely benign  Chief complaint/ Reason for visit-routine follow-up of neutropenia  Heme/Onc history: patient is a 57 year old African-American female with a past medical history significant for hypertension hyperlipidemia prediabetes and migraine referred for lymphocytosis.Her most recent CBC from 07/30/2021 showed white count of 4.9, H&H of 13.5/41.9 and a platelet count of 189.  Differential mainly showed neutropenia with an ANC of 1.7 with 35% neutrophils and relative lymphocytosis of 54.7% although the absolute lymphocyte count was normal at 2.7.  Looking back at her CBCs patient has had borderline low neutrophil counts which was 2.6 back in 2019 and since then has gradually drifted down to 1.7 presently.  Patient reports doing well overall.  Denies any recurrent infections or hospitalizations.  Denies any skin rash or joint pain patient denies any over-the-counter supplements other than multivitamins.  Denies any herbal supplements   Results of blood work from 08/18/2021 were as follows: CBC showed white count of 5.4, H&H of 13.5/41.9 and a platelet count of 192.  Differential showed relative neutropenia with 35% neutrophils and an ANC of 1.9.  B12 and folate levels were normal.  HIV and hepatitis C testing negative.  Smear review normal.    Interval history-patient has done well in the last 1 year.  Denies any recurrent infections.  Denies any specific complaints at this time  ECOG PS- 0 Pain scale- 0   Review of systems- Review of Systems  Constitutional:  Negative for chills, fever, malaise/fatigue and weight loss.   HENT:  Negative for congestion, ear discharge and nosebleeds.   Eyes:  Negative for blurred vision.  Respiratory:  Negative for cough, hemoptysis, sputum production, shortness of breath and wheezing.   Cardiovascular:  Negative for chest pain, palpitations, orthopnea and claudication.  Gastrointestinal:  Negative for abdominal pain, blood in stool, constipation, diarrhea, heartburn, melena, nausea and vomiting.  Genitourinary:  Negative for dysuria, flank pain, frequency, hematuria and urgency.  Musculoskeletal:  Negative for back pain, joint pain and myalgias.  Skin:  Negative for rash.  Neurological:  Negative for dizziness, tingling, focal weakness, seizures, weakness and headaches.  Endo/Heme/Allergies:  Does not bruise/bleed easily.  Psychiatric/Behavioral:  Negative for depression and suicidal ideas. The patient does not have insomnia.       Allergies  Allergen Reactions   Strawberry (Diagnostic) Hives, Itching and Swelling   Lipitor [Atorvastatin]     Muscle aches legs     Past Medical History:  Diagnosis Date   COVID-19    02/2021 paxlovid had   Fibroids    Genital herpes    Genital warts    Hyperlipidemia    Hypertension    Lymphocytosis    Migraine      Past Surgical History:  Procedure Laterality Date   COLONOSCOPY WITH PROPOFOL N/A 08/28/2018   Procedure: COLONOSCOPY WITH PROPOFOL;  Surgeon: Lin Landsman, MD;  Location: [facility] ENDOSCOPY;  Service: Gastroenterology;  Laterality: N/A;   ECTOPIC PREGNANCY SURGERY Left    EMBOLIZATION     For fibroid   ESOPHAGOGASTRODUODENOSCOPY (EGD) WITH PROPOFOL N/A 08/28/2018   Procedure: ESOPHAGOGASTRODUODENOSCOPY (EGD) WITH PROPOFOL;  Surgeon: Lin Landsman, MD;  Location: [place];  Service: Gastroenterology;  Laterality: N/A;    Social History   Socioeconomic History   Marital status: Single    Spouse name: Not on file   Number of children: 1   Years of education: Not on file   Highest education  level: Bachelor's degree (e.g., BA, AB, BS)  Occupational History   Not on file  Tobacco Use   Smoking status: Former    Types: Cigarettes    Quit date: 2007    Years since quitting: 17.1   Smokeless tobacco: Never  Vaping Use   Vaping Use: Never used  Substance and Sexual Activity   Alcohol use: Yes    Alcohol/week: 2.0 standard drinks of alcohol    Types: 2 Glasses of wine per week    Comment: per 2 weeks    Drug use: No   Sexual activity: Yes    Birth control/protection: Condom  Other Topics Concern   Not on file  Social History Narrative   1 daughter    Education officer, 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, applications   Social Determinants of Radio broadcast assistant Strain: Not on file  Food Insecurity: Not on file  Transportation Needs: Not on file  Physical Activity: Not on file  Stress: Not on file  Social Connections: Not on file  Intimate Partner Violence: Not on file    Family History  Problem Relation Age of Onset   Arthritis Mother    Diabetes Mother    Other Mother        brain tumor stroke like sx's   Diabetes Father    Cancer Brother    Breast cancer Maternal Aunt    Ovarian cancer Maternal Aunt    Aneurysm Maternal Grandmother    Cancer Maternal Grandfather    Colon cancer Neg Hx      Current Outpatient Medications:    erythromycin ophthalmic ointment, Place 1 application. into both eyes 3 (three) times daily. X 4-7 days, Disp: 3.5 g, Rfl: 0   fluticasone (FLONASE) 50 MCG/ACT nasal spray, Place 1-2 sprays into both nostrils daily., Disp: 16 g, Rfl: 12   hydrocortisone 2.5 % ointment, Apply topically 2 (two) times daily. Prn ears, Disp: 60 g, Rfl: 11   lisinopril-hydrochlorothiazide (ZESTORETIC) 10-12.5 MG tablet, Take 1 tablet by mouth every morning., Disp: 90 tablet, Rfl: 3   loratadine (CLARITIN) 10 MG tablet, Take 1 tablet (10 mg total) by mouth daily as needed for allergies., Disp: 90 tablet, Rfl: 3   methocarbamol (ROBAXIN) 500 MG  tablet, Take 1 tablet (500 mg total) by mouth at bedtime as needed for muscle spasms., Disp: 90 tablet, Rfl: 3   Olopatadine HCl (PATADAY) 0.7 % SOLN, Apply 1 drop to eye daily. prn, Disp: 2.5 mL, Rfl: 11   pantoprazole (PROTONIX) 40 MG tablet, TAKE 1 TABLET BY MOUTH ONCE DAILY (TAKE  30  MINUTES  BEFORE  A  MEAL), Disp: 90 tablet, Rfl: 3   pravastatin (PRAVACHOL) 10 MG tablet, Take 1 tablet (10 mg total) by mouth daily. After 6pm, Disp: 90 tablet, Rfl: 3   rizatriptan (MAXALT-MLT) 10 MG disintegrating tablet, Take 1 tablet (10 mg total) by mouth as needed for migraine. May repeat in 2 hours if needed, Disp: 9 tablet, Rfl: 11   valACYclovir (VALTREX) 500 MG tablet, Take 1 tablet (500 mg total) by mouth daily. For prevention bid x 3-7 days outbreak, Disp: 120 tablet, Rfl: 3  Physical exam:  Vitals:   09/13/22 1341  BP: 116/86  Pulse: 76  Resp: 18  Temp: (!) 96.9 F (36.1 C)  TempSrc: Tympanic  SpO2: 99%  Weight: 239 lb (108.4 kg)  Height: '5\' 8"'$  (1.727 m)   Physical Exam Cardiovascular:     Rate and Rhythm: Normal rate and regular rhythm.     Heart sounds: Normal heart sounds.  Pulmonary:     Effort: Pulmonary effort is normal.     Breath sounds: Normal breath sounds.  Abdominal:     General: Bowel sounds are normal.     Palpations: Abdomen is soft.  Skin:    General: Skin is warm and dry.  Neurological:     Mental Status: She is alert and oriented to person, place, and time.         Latest Ref Rng & Units 09/12/2022    2:07 PM  CMP  Glucose 70 - 99 mg/dL 78   BUN 6 - 23 mg/dL 9   Creatinine 0.40 - 1.20 mg/dL 0.92   Sodium 135 - 145 mEq/L 141   Potassium 3.5 - 5.1 mEq/L 4.1   Chloride 96 - 112 mEq/L 100   CO2 19 - 32 mEq/L 30   Calcium 8.4 - 10.5 mg/dL 10.1   Total Protein 6.0 - 8.3 g/dL 7.4   Total Bilirubin 0.2 - 1.2 mg/dL 0.4   Alkaline Phos 39 - 117 U/L 64   AST 0 - 37 U/L 30   ALT 0 - 35 U/L 29       Latest Ref Rng & Units 09/14/2022    9:37 AM  CBC  WBC  4.0 - 10.5 K/uL 6.6   Hemoglobin 12.0 - 15.0 g/dL 13.5   Hematocrit 36.0 - 46.0 % 41.4   Platelets 150.0 - 400.0 K/uL 217.0      Assessment and plan- Patient is a 57 y.o. female here for follow up of chronic neutropenia.  Patient's baseline white cell count fluctuates between 4-6.  Her absolute neutrophil count has been ranging between 1.7-2.3 with neutrophil percentage between 35 to 42%.  Although she has had relative lymphocytosis absolute lymphocyte count is normal.  No other cytopenias.No recurrent infections.  Patient does not require any follow-up with me at this time and can continue to follow-up with her primary care doctor.  If there is a continued decrease in her neutrophil count she can be referred to's for any future concerns   Visit Diagnosis 1. Chronic neutropenia (HCC)      Dr. Randa Evens, MD, MPH [facility] [id number] 09/15/2022 8:26 PM

## 2022-09-16 LAB — MEASLES/MUMPS/RUBELLA IMMUNITY
Mumps IgG: >300 AU/mL
Rubella: 1.23 Index
Rubeola IgG: 85.5 AU/mL

## 2022-09-16 LAB — QUANTIFERON-TB GOLD PLUS
Mitogen-NIL: >10 IU/mL
NIL: 0.03 IU/mL
QuantiFERON-TB Gold Plus: NEGATIVE
TB1-NIL: 0 IU/mL
TB2-NIL: 0 IU/mL

## 2022-09-16 NOTE — Telephone Encounter (Signed)
Pt came in and picked up paperwork and I informed her that one of her results had not been released yet. I will call her and let her know when it is released and she can come back and pick up

## 2022-09-18 NOTE — Progress Notes (Unsigned)
   SUBJECTIVE:  No chief complaint on file.  HPI Patient presents to clinic to transfer care.  No acute concerns today.  Hypertension Asymptomatic.  Compliant with current medication.  Takes Zestoretic 10-12.5 mg daily.  Tolerating medication well.  Hyperlipidemia On statin therapy.  Pravachol 10 mg daily.  Tolerating well.  No myalgias.   Chronic neutropenia Evaluated by oncology.  No further workup at this time.  If continues to decline can re-refer.  PERTINENT PMH / PSH: Hypertension Migraine Obesity class I Hyperlipidemia Prediabetes Chronic neutropenia   OBJECTIVE:  There were no vitals taken for this visit.   Physical Exam  ASSESSMENT/PLAN:  There are no diagnoses linked to this encounter. PDMP reviewed***  No follow-ups on file.  Carollee Leitz, MD

## 2022-09-18 NOTE — Patient Instructions (Incomplete)
It was a pleasure meeting you today. Thank you for allowing me to take part in your health care.  Our goals for today as we discussed include:  Refills of medications have been sent.  Recommend Shingles vaccine.  This is a 2 dose series and can be given at your local pharmacy.  Please talk to your pharmacist about this.   Referral sent for Mammogram. Please call to schedule appointment for the month of July 2024 [facility] [place], [place] [address] [phone]   Next Pap is due in July 2025.  You can call to schedule with PCP.  Follow-up with neurology for your migraines.  Follow-up in 6 months  If you have any questions or concerns, please do not hesitate to call the office at [phone].  I look forward to our next visit and until then take care and stay safe.  Regards,   Carollee Leitz, MD   [facility]

## 2022-09-19 ENCOUNTER — Ambulatory Visit: Payer: BC Managed Care – PPO | Admitting: Family Medicine

## 2022-09-19 ENCOUNTER — Encounter: Payer: Self-pay | Admitting: Family Medicine

## 2022-09-19 VITALS — BP 110/70 | HR 63 | Temp 98.2°F | Ht 68.0 in | Wt 239.8 lb

## 2022-09-19 DIAGNOSIS — I10 Essential (primary) hypertension: Secondary | ICD-10-CM

## 2022-09-19 DIAGNOSIS — G43001 Migraine without aura, not intractable, with status migrainosus: Secondary | ICD-10-CM | POA: Diagnosis not present

## 2022-09-19 DIAGNOSIS — K219 Gastro-esophageal reflux disease without esophagitis: Secondary | ICD-10-CM

## 2022-09-19 DIAGNOSIS — J309 Allergic rhinitis, unspecified: Secondary | ICD-10-CM

## 2022-09-19 DIAGNOSIS — B009 Herpesviral infection, unspecified: Secondary | ICD-10-CM

## 2022-09-19 DIAGNOSIS — D708 Other neutropenia: Secondary | ICD-10-CM

## 2022-09-19 DIAGNOSIS — M545 Low back pain, unspecified: Secondary | ICD-10-CM

## 2022-09-19 DIAGNOSIS — R7303 Prediabetes: Secondary | ICD-10-CM

## 2022-09-19 DIAGNOSIS — E782 Mixed hyperlipidemia: Secondary | ICD-10-CM

## 2022-09-19 DIAGNOSIS — I672 Cerebral atherosclerosis: Secondary | ICD-10-CM

## 2022-09-19 DIAGNOSIS — Z1231 Encounter for screening mammogram for malignant neoplasm of breast: Secondary | ICD-10-CM

## 2022-09-19 MED ORDER — LORATADINE 10 MG PO TABS: 10.0000 mg | ORAL_TABLET | Freq: Every day | ORAL | 3 refills | Status: DC | PRN

## 2022-09-19 MED ORDER — FLUTICASONE PROPIONATE 50 MCG/ACT NA SUSP: 1.0000 | Freq: Every day | NASAL | 12 refills | Status: DC

## 2022-09-19 MED ORDER — METHOCARBAMOL 500 MG PO TABS: 500.0000 mg | ORAL_TABLET | Freq: Every evening | ORAL | 3 refills | Status: DC | PRN

## 2022-09-19 MED ORDER — VALACYCLOVIR HCL 500 MG PO TABS: 500.0000 mg | ORAL_TABLET | Freq: Every day | ORAL | 3 refills | Status: DC

## 2022-09-19 MED ORDER — RIZATRIPTAN BENZOATE 10 MG PO TBDP: 10.0000 mg | ORAL_TABLET | ORAL | 11 refills | Status: DC | PRN

## 2022-09-19 MED ORDER — PRAVASTATIN SODIUM 10 MG PO TABS: 10.0000 mg | ORAL_TABLET | Freq: Every day | ORAL | 3 refills | Status: DC

## 2022-09-19 MED ORDER — PANTOPRAZOLE SODIUM 40 MG PO TBEC: DELAYED_RELEASE_TABLET | ORAL | 3 refills | Status: DC

## 2022-09-19 MED ORDER — LISINOPRIL-HYDROCHLOROTHIAZIDE 10-12.5 MG PO TABS: 1.0000 | ORAL_TABLET | Freq: Every morning | ORAL | 3 refills | Status: DC

## 2022-09-19 NOTE — Assessment & Plan Note (Addendum)
Chronic.  Workup completed by oncology.  No plans for further evaluation unless symptomatic or increases numbers. Follow-up with oncology as needed

## 2022-09-19 NOTE — Assessment & Plan Note (Signed)
Chronic.  Noted on MRI 3/22. On statin therapy and tolerating well. Continue Pravachol 10 mg daily Lipids in 6 months

## 2022-09-19 NOTE — Assessment & Plan Note (Signed)
Chronic.  Recent A1c 6.3. Check annually

## 2022-09-19 NOTE — Assessment & Plan Note (Signed)
Chronic.  Stable.  Controlled with Maxalt and Excedrin. Refill Maxalt 10 mg next dose 20 mg 24 hours Continue Excedrin as needed Follow-up with neurology as scheduled

## 2022-09-19 NOTE — Assessment & Plan Note (Signed)
Chronic. Off Pravachol x 1 month. Refill Pravachol 10 mg daily Repeat lipids 6 months

## 2022-09-19 NOTE — Assessment & Plan Note (Signed)
Chronic.  Stable. Refill Flonase 1-2 sprays daily Refill loratadine 10 mg daily

## 2022-09-19 NOTE — Assessment & Plan Note (Signed)
Chronic.  Stable.  Duodenal ulcer and small hiatal hernia noted on EGD 08/2018 Refill Protonix 40 mg daily

## 2022-09-19 NOTE — Assessment & Plan Note (Signed)
Chronic.  Stable.  Well-controlled on current medication.  Recent creatinine normal Refill Zestoretic 10-12.5 mg daily Labs at next visit.

## 2022-09-19 NOTE — Assessment & Plan Note (Signed)
Chronic.  Stable.  Currently on daily prophylactic treatment. Given no more than 1-2 outbreaks yearly can use medication for outbreaks only.  If increasing frequency can return to daily prophylaxis with Valtrex 500 mg daily.

## 2022-09-20 NOTE — Telephone Encounter (Signed)
LVM to call back to office 

## 2022-09-21 NOTE — Telephone Encounter (Signed)
Spoke to pt and she will let us know if she need  [place] to fax results

## 2023-03-02 ENCOUNTER — Encounter (INDEPENDENT_AMBULATORY_CARE_PROVIDER_SITE_OTHER): Payer: Self-pay

## 2023-03-21 NOTE — Progress Notes (Deleted)
   SUBJECTIVE:   No chief complaint on file.  HPI Patient presents to clinic to transfer care.  No acute concerns today.  Hypertension Asymptomatic.  Compliant with current medication.  Takes Zestoretic 10-12.5 mg daily.  Tolerating medication well.  Denies any headaches, visual changes, shortness of breath or chest pain.  No lower extremity edema.  Requesting refills  Hyperlipidemia On statin therapy.  Pravachol 10 mg daily.  Tolerating well.  No myalgias.  Has been without medication for 1 month secondary to insurance issues.  Requesting refills  Migraine with aura. Takes Maxalt as needed.  Has run out of medication and requesting refills.  Followed by neurology but missed last appointment.  Headaches controlled with Excedrin and Maxalt.     Chronic neutropenia Evaluated by oncology.  No further workup at this time.  If continues to decline can re-refer.  PERTINENT PMH / PSH: Hypertension Migraine Obesity class I Hyperlipidemia Prediabetes Chronic neutropenia   OBJECTIVE:  There were no vitals taken for this visit.   Physical Exam Constitutional:      General: She is not in acute distress.    Appearance: She is normal weight. She is not ill-appearing.  HENT:     Head: Normocephalic.  Eyes:     Conjunctiva/sclera: Conjunctivae normal.  Neck:     Thyroid: No thyromegaly or thyroid tenderness.  Cardiovascular:     Rate and Rhythm: Normal rate and regular rhythm.     Pulses: Normal pulses.  Pulmonary:     Effort: Pulmonary effort is normal.     Breath sounds: Normal breath sounds.  Abdominal:     General: Bowel sounds are normal.  Neurological:     Mental Status: She is alert. Mental status is at baseline.  Psychiatric:        Mood and Affect: Mood normal.        Behavior: Behavior normal.        Thought Content: Thought content normal.        Judgment: Judgment normal.    ASSESSMENT/PLAN:  There are no diagnoses linked to this encounter.  PDMP  reviewed  No follow-ups on file.  Dana Allan, MD

## 2023-03-22 ENCOUNTER — Ambulatory Visit: Payer: BC Managed Care – PPO | Admitting: Family Medicine

## 2023-04-11 ENCOUNTER — Ambulatory Visit: Payer: BC Managed Care – PPO | Admitting: Nurse Practitioner

## 2023-04-11 VITALS — BP 122/78 | HR 66 | Temp 97.8°F | Ht 68.0 in | Wt 243.8 lb

## 2023-04-11 DIAGNOSIS — H00021 Hordeolum internum right upper eyelid: Secondary | ICD-10-CM | POA: Diagnosis not present

## 2023-04-11 MED ORDER — ERYTHROMYCIN 5 MG/GM OP OINT: TOPICAL_OINTMENT | OPHTHALMIC | 0 refills | Status: DC

## 2023-04-11 NOTE — Patient Instructions (Signed)
Rx sent to pharmacy. Use warm compress. If symptoms not improving let us know.

## 2023-04-11 NOTE — Progress Notes (Signed)
Established Patient Office Visit  Subjective:  Patient ID: Nichole Rodriguez, female    DOB: 06-Apr-1966  Age: 57 y.o. MRN: [id number]  CC:  Chief Complaint  Patient presents with   Acute Visit    Possible pink eye    HPI  [facility] presents for itchy and redness of the sclera started on Sunday and the bump on the upper eyelid right yesterday.  Patient states that this morning her right eye was stuck together and has discharge yellow-green discharge.  Denies any visual disturbances.  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   Vitamin D deficiency 06/17/2019    Past Surgical History:  Procedure Laterality Date   COLONOSCOPY WITH PROPOFOL N/A 08/28/2018   Procedure: COLONOSCOPY WITH PROPOFOL;  Surgeon: Toney Reil, MD;  Location: [facility] ENDOSCOPY;  Service: Gastroenterology;  Laterality: N/A;   ECTOPIC PREGNANCY SURGERY Left    EMBOLIZATION     For fibroid   ESOPHAGOGASTRODUODENOSCOPY (EGD) WITH PROPOFOL N/A 08/28/2018   Procedure: ESOPHAGOGASTRODUODENOSCOPY (EGD) WITH PROPOFOL;  Surgeon: Toney Reil, MD;  Location: [facility] ENDOSCOPY;  Service: Gastroenterology;  Laterality: N/A;    Family History  Problem Relation Age of Onset   Arthritis Mother    Diabetes Mother    Other Mother        brain tumor stroke like sx's   Diabetes Father    Cancer Brother    Breast cancer Maternal Aunt    Ovarian cancer Maternal Aunt     Aneurysm Maternal Grandmother    Cancer Maternal Grandfather    Colon cancer Neg Hx     Social History   Socioeconomic History   Marital status: Single    Spouse name: Not on file   Number of children: 1   Years of education: Not on file   Highest education level: Bachelor's degree (e.g., BA, AB, BS)  Occupational History   Not on file  Tobacco Use   Smoking status: Former    Current packs/day: 0.00    Types: Cigarettes    Quit date: 2007    Years since quitting: 17.7   Smokeless tobacco: Never  Vaping Use   Vaping status: Never Used  Substance and Sexual Activity   Alcohol use: Yes    Alcohol/week: 2.0 standard drinks of alcohol    Types: 2 Glasses of wine per week    Comment: per 2 weeks    Drug use: No   Sexual activity: Yes    Birth control/protection: Condom  Other Topics Concern   Not on file  Social History Narrative   1 daughter    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   Social Determinants of Corporate investment banker Strain: Low Risk  (04/11/2023)   Overall Financial Resource Strain (CARDIA)    Difficulty of Paying Living Expenses: Not very  hard  Food Insecurity: No Food Insecurity (04/11/2023)   Hunger Vital Sign    Worried About Running Out of Food in the Last Year: Never true    Ran Out of Food in the Last Year: Never true  Transportation Needs: No Transportation Needs (04/11/2023)   PRAPARE - Administrator, Civil Service (Medical): No    Lack of Transportation (Non-Medical): No  Physical Activity: Sufficiently Active (04/11/2023)   Exercise Vital Sign    Days of Exercise per Week: 3 days    Minutes of Exercise per Session: 150+ min  Stress: No Stress Concern Present (04/11/2023)   [employer] of Occupational Health - Occupational Stress Questionnaire    Feeling of Stress : Only a little  Social Connections: Moderately Isolated (04/11/2023)   Social Connection and Isolation Panel [NHANES]     Frequency of Communication with Friends and Family: Twice a week    Frequency of Social Gatherings with Friends and Family: Once a week    Attends Religious Services: 1 to 4 times per year    Active Member of [employer] or Organizations: No    Attends Engineer, structural: Not on file    Marital Status: Never married  Intimate Partner Violence: Not on file     Outpatient Medications Prior to Visit  Medication Sig Dispense Refill   fluticasone (FLONASE) 50 MCG/ACT nasal spray Place 1-2 sprays into both nostrils daily. 16 g 12   hydrocortisone 2.5 % ointment Apply topically 2 (two) times daily. Prn ears 60 g 11   lisinopril-hydrochlorothiazide (ZESTORETIC) 10-12.5 MG tablet Take 1 tablet by mouth every morning. 90 tablet 3   loratadine (CLARITIN) 10 MG tablet Take 1 tablet (10 mg total) by mouth daily as needed for allergies. 90 tablet 3   methocarbamol (ROBAXIN) 500 MG tablet Take 1 tablet (500 mg total) by mouth at bedtime as needed for muscle spasms. 90 tablet 3   pantoprazole (PROTONIX) 40 MG tablet TAKE 1 TABLET BY MOUTH ONCE DAILY (TAKE  30  MINUTES  BEFORE  A  MEAL) 90 tablet 3   pravastatin (PRAVACHOL) 10 MG tablet Take 1 tablet (10 mg total) by mouth daily. After 6pm 90 tablet 3   rizatriptan (MAXALT-MLT) 10 MG disintegrating tablet Take 1 tablet (10 mg total) by mouth as needed for migraine. May repeat in 2 hours if needed 9 tablet 11   valACYclovir (VALTREX) 500 MG tablet Take 1 tablet (500 mg total) by mouth daily. For prevention bid x 3-7 days outbreak 120 tablet 3   No facility-administered medications prior to visit.    Allergies  Allergen Reactions   Strawberry (Diagnostic) Hives, Itching and Swelling   Lipitor [Atorvastatin]     Muscle aches legs    ROS Review of Systems Negative unless indicated in HPI.    Objective:    Physical Exam Constitutional:      Appearance: Normal appearance.  Eyes:     General:        Right eye: Hordeolum present. No  discharge.        Left eye: No discharge or hordeolum.  Cardiovascular:     Rate and Rhythm: Normal rate and regular rhythm.     Pulses: Normal pulses.     Heart sounds: Normal heart sounds.  Pulmonary:     Effort: Pulmonary effort is normal.     Breath sounds: Normal breath sounds. No wheezing.  Musculoskeletal:     Cervical back: Normal range of motion.  Skin:  General: Skin is warm.  Neurological:     General: No focal deficit present.     Mental Status: She is alert. Mental status is at baseline.     BP 122/78   Pulse 66   Temp 97.8 F (36.6 C)   Ht 5\' 8"  (1.727 m)   Wt 243 lb 12.8 oz (110.6 kg)   SpO2 98%   BMI 37.07 kg/m  Wt Readings from Last 3 Encounters:  04/11/23 243 lb 12.8 oz (110.6 kg)  09/19/22 239 lb 12.8 oz (108.8 kg)  09/13/22 239 lb (108.4 kg)     Health Maintenance  Topic Date Due   Zoster Vaccines- Shingrix (1 of 2) Never done   MAMMOGRAM  02/10/2023   COVID-19 Vaccine (4 - 2023-24 season) 04/27/2023 (Originally 03/19/2023)   INFLUENZA VACCINE  10/16/2023 (Originally 02/16/2023)   Cervical Cancer Screening (HPV/Pap Cotest)  01/26/2026   DTaP/Tdap/Td (2 - Td or Tdap) 12/08/2026   Colonoscopy  08/28/2028   Hepatitis C Screening  Completed   HIV Screening  Completed   HPV VACCINES  Aged Out    There are no preventive care reminders to display for this patient.  Lab Results  Component Value Date   TSH 2.91 09/12/2022   Lab Results  Component Value Date   WBC 6.6 09/14/2022   HGB 13.5 09/14/2022   HCT 41.4 09/14/2022   MCV 82.6 09/14/2022   PLT 217.0 09/14/2022   Lab Results  Component Value Date   NA 141 09/12/2022   K 4.1 09/12/2022   CO2 30 09/12/2022   GLUCOSE 78 09/12/2022   BUN 9 09/12/2022   CREATININE 0.92 09/12/2022   BILITOT 0.4 09/12/2022   ALKPHOS 64 09/12/2022   AST 30 09/12/2022   ALT 29 09/12/2022   PROT 7.4 09/12/2022   ALBUMIN 4.4 09/12/2022   CALCIUM 10.1 09/12/2022   GFR 70.72 09/12/2022   Lab Results   Component Value Date   CHOL 240 (H) 09/12/2022   Lab Results  Component Value Date   HDL 73.40 09/12/2022   Lab Results  Component Value Date   LDLCALC 149 (H) 09/12/2022   Lab Results  Component Value Date   TRIG 86.0 09/12/2022   Lab Results  Component Value Date   CHOLHDL 3 09/12/2022   Lab Results  Component Value Date   HGBA1C 6.3 09/12/2022      Assessment & Plan:  Hordeolum internum of right upper eyelid Assessment & Plan: Conjunctiva and sclera normal.  Normal on the right upper eyelid. Will treat with erythromycin ointment. Advised patient to use warm compress   Other orders -     Erythromycin; Use one half inch four times daily to affected eye (s) x 7 days.  Dispense: 3.5 g; Refill: 0    Follow-up: No follow-ups on file.   Kara Dies, NP

## 2023-04-16 DIAGNOSIS — H00021 Hordeolum internum right upper eyelid: Secondary | ICD-10-CM | POA: Insufficient documentation

## 2023-04-16 NOTE — Assessment & Plan Note (Signed)
Conjunctiva and sclera normal.  Normal on the right upper eyelid. Will treat with erythromycin ointment. Advised patient to use warm compress

## 2023-04-17 ENCOUNTER — Ambulatory Visit: Payer: BC Managed Care – PPO | Admitting: Family Medicine

## 2023-04-17 ENCOUNTER — Encounter: Payer: Self-pay | Admitting: Family Medicine

## 2023-04-17 VITALS — BP 118/68 | HR 69 | Temp 98.2°F | Resp 16 | Ht 68.0 in | Wt 240.5 lb

## 2023-04-17 DIAGNOSIS — Z23 Encounter for immunization: Secondary | ICD-10-CM

## 2023-04-17 DIAGNOSIS — L309 Dermatitis, unspecified: Secondary | ICD-10-CM

## 2023-04-17 DIAGNOSIS — E669 Obesity, unspecified: Secondary | ICD-10-CM

## 2023-04-17 DIAGNOSIS — Z1231 Encounter for screening mammogram for malignant neoplasm of breast: Secondary | ICD-10-CM

## 2023-04-17 DIAGNOSIS — I10 Essential (primary) hypertension: Secondary | ICD-10-CM

## 2023-04-17 DIAGNOSIS — G43709 Chronic migraine without aura, not intractable, without status migrainosus: Secondary | ICD-10-CM | POA: Diagnosis not present

## 2023-04-17 DIAGNOSIS — M62838 Other muscle spasm: Secondary | ICD-10-CM

## 2023-04-17 DIAGNOSIS — K219 Gastro-esophageal reflux disease without esophagitis: Secondary | ICD-10-CM

## 2023-04-17 MED ORDER — HYDROCORTISONE 2.5 % EX OINT: TOPICAL_OINTMENT | Freq: Two times a day (BID) | CUTANEOUS | 11 refills | Status: DC

## 2023-04-17 MED ORDER — TOPIRAMATE 50 MG PO TABS: 50.0000 mg | ORAL_TABLET | Freq: Every day | ORAL | 0 refills | Status: DC
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
## 2023-04-17 NOTE — Patient Instructions (Addendum)
It was a pleasure meeting you today. Thank you for allowing me to take part in your health care.  Our goals for today as we discussed include:  Call to schedule appointment.  If needing referral I am happy to send one in [facility] Dr Marjory Lies, Glenford Bayley, MD   Restart Topiramate 50 mg at night. After 1-2 weks can increase to two times a day  Call pharmacy for refills of Maxalt  Flu vaccine today  Recommend Shingles vaccine.  This is a 2 dose series and can be given at your local pharmacy.  Please talk to your pharmacist about this.   Referral sent for Mammogram. Please call to schedule appointment. [facility] [address] [place], [place] [address] [phone]    If you have any questions or concerns, please do not hesitate to call the office at [phone].  I look forward to our next visit and until then take care and stay safe.  Regards,   Dana Allan, MD   [facility]

## 2023-04-24 ENCOUNTER — Encounter: Payer: Self-pay | Admitting: Family Medicine

## 2023-04-24 DIAGNOSIS — L309 Dermatitis, unspecified: Secondary | ICD-10-CM | POA: Insufficient documentation

## 2023-04-24 DIAGNOSIS — M62838 Other muscle spasm: Secondary | ICD-10-CM | POA: Insufficient documentation

## 2023-04-24 DIAGNOSIS — Z23 Encounter for immunization: Secondary | ICD-10-CM | POA: Insufficient documentation

## 2023-04-24 NOTE — Assessment & Plan Note (Deleted)
Increased frequency of migraines since April 2024, occurring approximately three times per week. Previously managed with Topamax and Maxalt, but has been off these medications for approximately six months.  -Start Topamax 25mg  daily. -Refill Maxalt for use at onset of migraines. -Consider referral back to neurology if migraines do not improve. -Encourage patient to have eyes checked due to reported poor vision and potential strain contributing to migraines.

## 2023-04-24 NOTE — Assessment & Plan Note (Signed)
Chronic Refill Hydrocortisone 2.5% ointment BID as needed

## 2023-04-24 NOTE — Assessment & Plan Note (Signed)
  Poor Nutrition and Sleep Reports poor sleep (4-5 hours per night) and inconsistent meal intake, which may be contributing to fatigue and increased frequency of migraines. -Encourage regular meals and improved sleep hygiene.

## 2023-04-24 NOTE — Assessment & Plan Note (Signed)
Chronic.  Stable.  Well-controlled on current medication.  Continue Zestoretic 10-12.5 mg daily Call pharmacy for refills

## 2023-04-24 NOTE — Assessment & Plan Note (Signed)
Chronic Continue Methocarbamol 500 mg at bedtime prn Call pharmacy for refills

## 2023-04-24 NOTE — Assessment & Plan Note (Signed)
Increased frequency of migraines since April 2024, occurring approximately three times per week. Previously managed with Topamax and Maxalt, but has been off these medications for approximately six months.  -Start Topamax 25mg  daily. -Refill Maxalt for use at onset of migraines. -Consider referral back to neurology if migraines do not improve. -Encourage patient to have eyes checked due to reported poor vision and potential strain contributing to migraines.

## 2023-04-24 NOTE — Assessment & Plan Note (Signed)
Chronic.  Stable.   Continue Protonix 40 mg daily Call pharmacy for refills

## 2023-04-25 ENCOUNTER — Ambulatory Visit: Payer: BC Managed Care – PPO | Admitting: Family Medicine

## 2023-04-27 ENCOUNTER — Ambulatory Visit
Admission: RE | Admit: 2023-04-27 | Discharge: 2023-04-27 | Disposition: A | Discharge status: Home / Self Care | Payer: BC Managed Care – PPO | Source: Ambulatory Visit | Attending: Family Medicine | Admitting: Family Medicine

## 2023-04-27 DIAGNOSIS — Z1231 Encounter for screening mammogram for malignant neoplasm of breast: Secondary | ICD-10-CM | POA: Diagnosis present

## 2023-06-06 NOTE — Telephone Encounter (Signed)
Error

## 2023-06-08 ENCOUNTER — Other Ambulatory Visit: Payer: Self-pay | Admitting: Family Medicine

## 2023-06-08 DIAGNOSIS — G43709 Chronic migraine without aura, not intractable, without status migrainosus: Secondary | ICD-10-CM

## 2023-08-30 ENCOUNTER — Other Ambulatory Visit: Payer: Self-pay | Admitting: Family Medicine

## 2023-08-30 DIAGNOSIS — K219 Gastro-esophageal reflux disease without esophagitis: Secondary | ICD-10-CM

## 2023-08-30 DIAGNOSIS — I10 Essential (primary) hypertension: Secondary | ICD-10-CM

## 2023-08-30 DIAGNOSIS — J309 Allergic rhinitis, unspecified: Secondary | ICD-10-CM

## 2023-08-30 MED ORDER — LISINOPRIL-HYDROCHLOROTHIAZIDE 10-12.5 MG PO TABS: 1.0000 | ORAL_TABLET | Freq: Every morning | ORAL | 1 refills | Status: DC

## 2023-08-30 MED ORDER — PANTOPRAZOLE SODIUM 40 MG PO TBEC: DELAYED_RELEASE_TABLET | ORAL | 1 refills | Status: DC

## 2023-08-30 MED ORDER — FLUTICASONE PROPIONATE 50 MCG/ACT NA SUSP: 1.0000 | Freq: Every day | NASAL | 2 refills | Status: DC

## 2023-08-30 NOTE — Telephone Encounter (Signed)
Copied from CRM [id number]. Topic: Clinical - Medication Refill >> Aug 30, 2023  9:04 AM Isabell A wrote: Most Recent Primary Care Visit:  Provider: Dana Allan  Department: [facility]  Visit Type: OFFICE VISIT  Date: 04/17/2023  Medication: lisinopril-hydrochlorothiazide (ZESTORETIC) 10-12.5 MG tablet loratadine (CLARITIN) 10 MG tablet pantoprazole (PROTONIX) 40 MG tablet  fluticasone (FLONASE) 50 MCG/ACT nasal spray    Has the patient contacted their pharmacy? No (Agent: If no, request that the patient contact the pharmacy for the refill. If patient does not wish to contact the pharmacy document the reason why and proceed with request.) (Agent: If yes, when and what did the pharmacy advise?)  Is this the correct pharmacy for this prescription? Yes If no, delete pharmacy and type the correct one.  This is the patient's preferred pharmacy:  [facility] [address] [place], [place] [address] [phone]   Has the prescription been filled recently? Yes  Is the patient out of the medication? Yes   Has the patient been seen for an appointment in the last year OR does the patient have an upcoming appointment? Yes  Can we respond through MyChart? No  Agent: Please be advised that Rx refills may take up to 3 business days. We ask that you follow-up with your pharmacy.

## 2023-09-29 ENCOUNTER — Encounter: Payer: Self-pay | Admitting: Family Medicine

## 2023-09-29 ENCOUNTER — Ambulatory Visit (INDEPENDENT_AMBULATORY_CARE_PROVIDER_SITE_OTHER): Admitting: Family Medicine

## 2023-09-29 VITALS — BP 100/70 | HR 70 | Temp 98.0°F | Resp 21 | Ht 68.0 in | Wt 245.2 lb

## 2023-09-29 DIAGNOSIS — E559 Vitamin D deficiency, unspecified: Secondary | ICD-10-CM

## 2023-09-29 DIAGNOSIS — R8290 Unspecified abnormal findings in urine: Secondary | ICD-10-CM

## 2023-09-29 DIAGNOSIS — R7309 Other abnormal glucose: Secondary | ICD-10-CM

## 2023-09-29 DIAGNOSIS — M545 Low back pain, unspecified: Secondary | ICD-10-CM

## 2023-09-29 DIAGNOSIS — E782 Mixed hyperlipidemia: Secondary | ICD-10-CM

## 2023-09-29 DIAGNOSIS — G43709 Chronic migraine without aura, not intractable, without status migrainosus: Secondary | ICD-10-CM

## 2023-09-29 DIAGNOSIS — J309 Allergic rhinitis, unspecified: Secondary | ICD-10-CM

## 2023-09-29 DIAGNOSIS — E785 Hyperlipidemia, unspecified: Secondary | ICD-10-CM

## 2023-09-29 DIAGNOSIS — I10 Essential (primary) hypertension: Secondary | ICD-10-CM

## 2023-09-29 DIAGNOSIS — E538 Deficiency of other specified B group vitamins: Secondary | ICD-10-CM

## 2023-09-29 DIAGNOSIS — B009 Herpesviral infection, unspecified: Secondary | ICD-10-CM

## 2023-09-29 DIAGNOSIS — E669 Obesity, unspecified: Secondary | ICD-10-CM

## 2023-09-29 DIAGNOSIS — G43001 Migraine without aura, not intractable, with status migrainosus: Secondary | ICD-10-CM

## 2023-09-29 DIAGNOSIS — Z0000 Encounter for general adult medical examination without abnormal findings: Secondary | ICD-10-CM

## 2023-09-29 MED ORDER — LORATADINE 10 MG PO TABS
10.0000 mg | ORAL_TABLET | Freq: Every day | ORAL | 3 refills | Status: DC | PRN
Start: 2023-09-29 — End: 2024-04-01

## 2023-09-29 MED ORDER — PRAVASTATIN SODIUM 10 MG PO TABS: 10.0000 mg | ORAL_TABLET | Freq: Every day | ORAL | 3 refills | Status: AC

## 2023-09-29 MED ORDER — METHOCARBAMOL 500 MG PO TABS: 500.0000 mg | ORAL_TABLET | Freq: Every evening | ORAL | 3 refills | Status: AC | PRN

## 2023-09-29 MED ORDER — VALACYCLOVIR HCL 500 MG PO TABS
500.0000 mg | ORAL_TABLET | Freq: Every day | ORAL | 3 refills | Status: DC
Start: 2023-09-29 — End: 2024-04-01

## 2023-09-29 MED ORDER — TOPIRAMATE 50 MG PO TABS
ORAL_TABLET | ORAL | 1 refills | Status: DC
Start: 2023-09-29 — End: 2023-12-04

## 2023-09-29 MED ORDER — RIZATRIPTAN BENZOATE 10 MG PO TBDP
10.0000 mg | ORAL_TABLET | ORAL | 11 refills | Status: DC | PRN
Start: 2023-09-29 — End: 2024-04-01

## 2023-09-29 NOTE — Progress Notes (Signed)
 SUBJECTIVE:   Chief Complaint  Patient presents with   Back Pain    Having spams   Obesity    Wants to talk about weight loss clinic.   HPI Presents for acute visit  Discussed the use of AI scribe software for clinical note transcription with the patient, who gave verbal consent to proceed.  History of Present Illness Nichole Rodriguez is a 58 year old female who presents with back pain and weight management concerns.  She has been experiencing back pain characterized by spasms that began last week. The pain is sharp and located on the right side, particularly when bending over, such as when using the kitchen cupboard. It differs from her usual lower back pain, feeling more internal and exacerbated by bending. The pain has been improving, and she has been using Robaxin, taking half a tablet as needed, which has been effective. She did not attend work on Monday due to the severity of the pain, but it started to subside by Tuesday. No urinary symptoms, fever, or other systemic symptoms are present.  She is concerned about weight gain, which she attributes to recent stressors, including the passing of her brother from lung cancer in November and her mother's health issues. She wants to consult a nutritionist to help with weight management and prefers to avoid medications like Ozempic.  Regarding her migraine management, she has not been taking Topamax or Maxalt due to a lack of refills. She has been managing her migraines with over-the-counter Excedrin. She has not seen her neurologist in a while and plans to schedule an appointment.  She has a family history of kidney disease, as her father had the condition. She has not had recent blood work done and is unsure about her kidney function.      PERTINENT PMH / PSH: As above  OBJECTIVE:  BP 100/70   Pulse 70   Temp 98 F (36.7 C)   Resp (!) 21   Ht 5\' 8"  (1.727 m)   Wt 245 lb 4 oz (111.2 kg)   SpO2 100%   BMI 37.29 kg/m     Physical Exam Vitals reviewed.  Constitutional:      General: She is not in acute distress.    Appearance: Normal appearance. She is obese. She is not ill-appearing, toxic-appearing or diaphoretic.  Eyes:     General:        Right eye: No discharge.        Left eye: No discharge.     Conjunctiva/sclera: Conjunctivae normal.  Cardiovascular:     Rate and Rhythm: Normal rate and regular rhythm.     Heart sounds: Normal heart sounds.  Pulmonary:     Effort: Pulmonary effort is normal.     Breath sounds: Normal breath sounds.  Abdominal:     General: Bowel sounds are normal.  Musculoskeletal:        General: Normal range of motion.  Skin:    General: Skin is warm and dry.  Neurological:     General: No focal deficit present.     Mental Status: She is alert and oriented to person, place, and time. Mental status is at baseline.  Psychiatric:        Mood and Affect: Mood normal.        Behavior: Behavior normal.        Thought Content: Thought content normal.        Judgment: Judgment normal.  09/29/2023    2:08 PM 04/17/2023    1:03 PM 04/11/2023    3:45 PM 09/19/2022    8:56 AM 06/06/2022    3:14 PM  Depression screen PHQ 2/9  Decreased Interest 1  2 0 0  Down, Depressed, Hopeless 0 0 0 0 0  PHQ - 2 Score 1 0 2 0 0  Altered sleeping 0 1 0  0  Tired, decreased energy 2 1 2   0  Change in appetite 0 1 0  0  Feeling bad or failure about yourself  0 0 0  0  Trouble concentrating 0 0 0  0  Moving slowly or fidgety/restless 0 0 0  0  Suicidal thoughts 0 0 0  0  PHQ-9 Score 3 3 4   0  Difficult doing work/chores Not difficult at all Somewhat difficult Not difficult at all  Not difficult at all      09/29/2023    2:09 PM 04/17/2023    1:04 PM 04/11/2023    3:46 PM 09/19/2022    8:56 AM  GAD 7 : Generalized Anxiety Score  Nervous, Anxious, on Edge 0 0 0 0  Control/stop worrying 0 0 1 0  Worry too much - different things 0 1 0 0  Trouble relaxing 0 1 1 0  Restless  0 0 0 0  Easily annoyed or irritable 0 0 0 0  Afraid - awful might happen 0 1 1 0  Total GAD 7 Score 0 3 3 0  Anxiety Difficulty Not difficult at all Not difficult at all Not difficult at all Not difficult at all    ASSESSMENT/PLAN:  Essential hypertension -     CBC with Differential/Platelet -     Comprehensive metabolic panel -     Urinalysis, Routine w reflex microscopic -     CBC with Differential/Platelet  Hyperlipidemia, unspecified hyperlipidemia type Assessment & Plan: Chronic. Refill Pravachol 10 mg daily Check fasting lipids  Orders: -     Lipid panel  Abnormal glucose -     Hemoglobin A1c -     Hemoglobin A1c  Vitamin B 12 deficiency -     Vitamin B12  Vitamin D deficiency -     VITAMIN D 25 Hydroxy (Vit-D Deficiency, Fractures) -     VITAMIN D 25 Hydroxy (Vit-D Deficiency, Fractures)  Low back pain without sciatica, unspecified back pain laterality, unspecified chronicity Assessment & Plan: Acute back pain with muscle spasms likely due to muscle strain. Pain resolving, supporting muscle strain diagnosis. No red flags - Refill Robaxin 500 mg, take half a tablet as needed for muscle spasms. - Order urinalysis - Monitor symptoms; if pain persists or worsens, consider further evaluation.  Orders: -     Methocarbamol; Take 1 tablet (500 mg total) by mouth at bedtime as needed for muscle spasms.  Dispense: 90 tablet; Refill: 3 -     Microscopic Examination -     Specimen Status -     Specimen status report  Allergic rhinitis, unspecified seasonality, unspecified trigger Assessment & Plan: Chronic.  Stable. Continue Flonase 1-2 sprays daily Refill loratadine 10 mg daily  Orders: -     Loratadine; Take 1 tablet (10 mg total) by mouth daily as needed for allergies.  Dispense: 90 tablet; Refill: 3  Mixed hyperlipidemia Assessment & Plan: Chronic. Refill Pravachol 10 mg daily Check fasting lipids  Orders: -     Pravastatin Sodium; Take 1 tablet (10 mg  total) by mouth daily.  After 6pm  Dispense: 90 tablet; Refill: 3  Migraine without aura and with status migrainosus, not intractable Assessment & Plan: Migraines without current prophylactic treatment.  - Schedule appointment with neurologist for migraine management. - Refill Topiramate for prophylactic treatment if migraines persist. - Refill Maxalt prescription for acute migraine relief.  Orders: -     Rizatriptan Benzoate; Take 1 tablet (10 mg total) by mouth as needed for migraine. May repeat in 2 hours if needed  Dispense: 9 tablet; Refill: 11  Chronic migraine without aura without status migrainosus, not intractable Assessment & Plan: Migraines without current prophylactic treatment.  - Schedule appointment with neurologist for migraine management. - Refill Topiramate for prophylactic treatment if migraines persist. - Refill Maxalt prescription for acute migraine relief.  Orders: -     Topiramate; TAKE ONE TABLET BY MOUTH AT BEDTIME FOR ONE TO TWO WEEKS. THEN INCREASE TO TAKE ONE TABLET BY MOUTH TWICE DAILY  Dispense: 90 tablet; Refill: 1  HSV infection Assessment & Plan: No recent outbreaks -Refill valtrex  Orders: -     valACYclovir HCl; Take 1 tablet (500 mg total) by mouth daily. For prevention bid x 3-7 days outbreak  Dispense: 120 tablet; Refill: 3  Obesity (BMI 30-39.9) Assessment & Plan: Significant weight gain with preference for lifestyle modifications over pharmacological interventions. - Provide information on [facility] for nutritional counseling. - Encourage to contact the center for an appointment.    Other orders -     Specimen status report      PDMP reviewed  Return if symptoms worsen or fail to improve, for PCP.  Dana Allan, MD

## 2023-09-29 NOTE — Patient Instructions (Addendum)
 It was a pleasure meeting you today. Thank you for allowing me to take part in your health care.  Our goals for today as we discussed include:  Refills sent for requested medications  We will get some labs today.  If they are abnormal or we need to do something about them, I will call you.  If they are normal, I will send you a message on MyChart (if it is active) or a letter in the mail.  If you don't hear from [place] in 2 weeks, please call the office at the number below.    Look at [facility] website to see if this may be an option for you with your weight loss. [facility] [address] [place] [phone]     This is a list of the screening recommended for you and due dates:  Health Maintenance  Topic Date Due   Pneumococcal Vaccination (1 of 2 - PCV) Never done   Zoster (Shingles) Vaccine (1 of 2) Never done   COVID-19 Vaccine (4 - 2024-25 season) 03/19/2023   Mammogram  04/26/2025   Pap with HPV screening  01/26/2026   DTaP/Tdap/Td vaccine (2 - Td or Tdap) 12/08/2026   Colon Cancer Screening  08/28/2028   Flu Shot  Completed   Hepatitis C Screening  Completed   HIV Screening  Completed   HPV Vaccine  Aged Out     If you have any questions or concerns, please do not hesitate to call the office at [phone].  I look forward to our next visit and until then take care and stay safe.  Regards,   Dana Allan, MD   [facility]

## 2023-09-30 LAB — CBC WITH DIFFERENTIAL/PLATELET

## 2023-09-30 LAB — HEMOGLOBIN A1C

## 2023-09-30 LAB — SPECIMEN STATUS

## 2023-09-30 LAB — URINALYSIS, ROUTINE W REFLEX MICROSCOPIC
Bilirubin, UA: NEGATIVE
Glucose, UA: NEGATIVE
Ketones, UA: NEGATIVE
Nitrite, UA: NEGATIVE
RBC, UA: NEGATIVE
Specific Gravity, UA: 1.016 (ref 1.005–1.030)
Urobilinogen, Ur: 0.2 mg/dL (ref 0.2–1.0)
pH, UA: 7 (ref 5.0–7.5)

## 2023-09-30 LAB — LIPID PANEL
Chol/HDL Ratio: 3.1 ratio (ref 0.0–4.4)
Cholesterol, Total: 217 mg/dL — ABNORMAL HIGH (ref 100–199)
HDL: 71 mg/dL (ref >39)
LDL Chol Calc (NIH): 128 mg/dL — ABNORMAL HIGH (ref 0–99)
Triglycerides: 103 mg/dL (ref 0–149)
VLDL Cholesterol Cal: 18 mg/dL (ref 5–40)

## 2023-09-30 LAB — COMPREHENSIVE METABOLIC PANEL
ALT: 37 IU/L — ABNORMAL HIGH (ref 0–32)
AST: 37 IU/L (ref 0–40)
Albumin: 4.6 g/dL (ref 3.8–4.9)
Alkaline Phosphatase: 77 IU/L (ref 44–121)
BUN/Creatinine Ratio: 8 — ABNORMAL LOW (ref 9–23)
BUN: 7 mg/dL (ref 6–24)
Bilirubin Total: 0.2 mg/dL (ref 0.0–1.2)
CO2: 28 mmol/L (ref 20–29)
Calcium: 9.8 mg/dL (ref 8.7–10.2)
Chloride: 99 mmol/L (ref 96–106)
Creatinine, Ser: 0.89 mg/dL (ref 0.57–1.00)
Globulin, Total: 2.5 g/dL (ref 1.5–4.5)
Glucose: 80 mg/dL (ref 70–99)
Potassium: 4.1 mmol/L (ref 3.5–5.2)
Sodium: 141 mmol/L (ref 134–144)
Total Protein: 7.1 g/dL (ref 6.0–8.5)
eGFR: 77 mL/min/{1.73_m2} (ref >59)

## 2023-09-30 LAB — MICROSCOPIC EXAMINATION
Bacteria, UA: NONE SEEN
Casts: NONE SEEN /LPF
WBC, UA: >30 /HPF — AB (ref 0–5)

## 2023-09-30 LAB — VITAMIN B12: Vitamin B-12: 764 pg/mL (ref 232–1245)

## 2023-10-02 LAB — CBC WITH DIFFERENTIAL/PLATELET
Basophils Absolute: 0.1 10*3/uL (ref 0.0–0.2)
Basos: 1 %
EOS (ABSOLUTE): 0.1 10*3/uL (ref 0.0–0.4)
Eos: 2 %
Hematocrit: 43.5 % (ref 34.0–46.6)
Hemoglobin: 13.9 g/dL (ref 11.1–15.9)
Immature Grans (Abs): 0 10*3/uL (ref 0.0–0.1)
Immature Granulocytes: 0 %
Lymphocytes Absolute: 3.2 10*3/uL — ABNORMAL HIGH (ref 0.7–3.1)
Lymphs: 50 %
MCH: 25.9 pg — ABNORMAL LOW (ref 26.6–33.0)
MCHC: 32 g/dL (ref 31.5–35.7)
MCV: 81 fL (ref 79–97)
Monocytes Absolute: 0.6 10*3/uL (ref 0.1–0.9)
Monocytes: 9 %
Neutrophils Absolute: 2.5 10*3/uL (ref 1.4–7.0)
Neutrophils: 38 %
Platelets: 224 10*3/uL (ref 150–450)
RBC: 5.36 x10E6/uL — ABNORMAL HIGH (ref 3.77–5.28)
RDW: 13.3 % (ref 11.7–15.4)
WBC: 6.5 10*3/uL (ref 3.4–10.8)

## 2023-10-02 LAB — SPECIMEN STATUS REPORT

## 2023-10-02 LAB — HEMOGLOBIN A1C
Est. average glucose Bld gHb Est-mCnc: 131 mg/dL
Hgb A1c MFr Bld: 6.2 % — ABNORMAL HIGH (ref 4.8–5.6)

## 2023-10-02 LAB — VITAMIN D 25 HYDROXY (VIT D DEFICIENCY, FRACTURES): Vit D, 25-Hydroxy: 28.9 ng/mL — ABNORMAL LOW (ref 30.0–100.0)

## 2023-10-04 ENCOUNTER — Encounter: Payer: Self-pay | Admitting: Family Medicine

## 2023-10-04 DIAGNOSIS — R7309 Other abnormal glucose: Secondary | ICD-10-CM | POA: Insufficient documentation

## 2023-10-04 DIAGNOSIS — E559 Vitamin D deficiency, unspecified: Secondary | ICD-10-CM | POA: Insufficient documentation

## 2023-10-04 DIAGNOSIS — E538 Deficiency of other specified B group vitamins: Secondary | ICD-10-CM | POA: Insufficient documentation

## 2023-10-04 LAB — VITAMIN D 25 HYDROXY (VIT D DEFICIENCY, FRACTURES)

## 2023-10-04 MED ORDER — VITAMIN D (ERGOCALCIFEROL) 1.25 MG (50000 UNIT) PO CAPS
50000.0000 [IU] | ORAL_CAPSULE | ORAL | 1 refills | Status: AC
Start: 2023-10-04 — End: ?

## 2023-10-04 NOTE — Assessment & Plan Note (Signed)
 Significant weight gain with preference for lifestyle modifications over pharmacological interventions. - Provide information on [facility] for nutritional counseling. - Encourage to contact the center for an appointment.

## 2023-10-04 NOTE — Assessment & Plan Note (Signed)
 Chronic. Refill Pravachol 10 mg daily Check fasting lipids

## 2023-10-04 NOTE — Assessment & Plan Note (Addendum)
 Migraines without current prophylactic treatment.  - Schedule appointment with neurologist for migraine management. - Refill Topiramate for prophylactic treatment if migraines persist. - Refill Maxalt prescription for acute migraine relief.

## 2023-10-04 NOTE — Assessment & Plan Note (Signed)
 No recent outbreaks -Refill valtrex

## 2023-10-04 NOTE — Assessment & Plan Note (Signed)
 Chronic.  Stable. Continue Flonase 1-2 sprays daily Refill loratadine 10 mg daily

## 2023-10-04 NOTE — Assessment & Plan Note (Addendum)
 Acute back pain with muscle spasms likely due to muscle strain. Pain resolving, supporting muscle strain diagnosis. No red flags - Refill Robaxin 500 mg, take half a tablet as needed for muscle spasms. - Order urinalysis - Monitor symptoms; if pain persists or worsens, consider further evaluation.

## 2023-11-30 ENCOUNTER — Other Ambulatory Visit: Payer: Self-pay | Admitting: Family Medicine

## 2023-11-30 DIAGNOSIS — G43709 Chronic migraine without aura, not intractable, without status migrainosus: Secondary | ICD-10-CM

## 2023-11-30 DIAGNOSIS — J309 Allergic rhinitis, unspecified: Secondary | ICD-10-CM

## 2024-03-21 ENCOUNTER — Ambulatory Visit: Payer: Self-pay

## 2024-03-21 NOTE — Telephone Encounter (Signed)
 FYI Only or Action Required?: FYI only for provider.  Patient was last seen in primary care on 09/29/2023 by Hope Merle, MD.  Called Nurse Triage reporting Migraine and Breast Pain.  Symptoms began several weeks ago.  Interventions attempted: Prescription medications: daily migraine preventative and rescue migraine medication.  Symptoms are: unchanged.  Triage Disposition: See PCP Within 2 Weeks  Patient/caregiver understands and will follow disposition?: Yes   Copied from CRM [id number]. Topic: Clinical - Red Word Triage >> Mar 21, 2024  9:52 AM [place] J wrote: [facility] that prompted transfer to Nurse Triage: Sharp pain in right breast that shifts to the other side sometimes. She is also having migraines starting from the back of her head.  Patient is not established with a provider. Her old provider left the practice in June. Reason for Disposition  [1] Breast pain AND [2] cause is not known  Headache is a chronic symptom (recurrent or ongoing AND present > 4 weeks)  Answer Assessment - Initial Assessment Questions 1. LOCATION: Where does it hurt?      Back of head on the left 2. ONSET: When did the headache start? (e.g., minutes, hours, days)      One week ago 3. PATTERN: Does the pain come and go, or has it been constant since it started?     Comes and goes.  Has had headache 2-3 days over the past week 4. SEVERITY: How bad is the pain? and What does it keep you from doing?  (e.g., Scale 1-10; mild, moderate, or severe)     No head ache at time of call 5. RECURRENT SYMPTOM: Have you ever had headaches before? If Yes, ask: When was the last time? and What happened that time?      History of migraines 6. CAUSE: What do you think is causing the headache?     Possible migraine 7. MIGRAINE: Have you been diagnosed with migraine headaches? If Yes, ask: Is this headache similar?      Different than migraine, typically migraines are in the front of forehead 8.  HEAD INJURY: Has there been any recent injury to your head?      denies 9. OTHER SYMPTOMS: Do you have any other symptoms? (e.g., fever, stiff neck, eye pain, sore throat, cold symptoms)     denies 10. PREGNANCY: Is there any chance you are pregnant? When was your last menstrual period?       N/a  Answer Assessment - Initial Assessment Questions 1. SYMPTOM: What's the main symptom you're concerned about?  (e.g., lump, nipple discharge, pain, rash)    sharp Pain to right breast every so often. Painful and tender to touch 2. LOCATION: Where is the pain located?     Right breast, top of breast 3. ONSET: When did pain  start?     X 2 weeks off and on 4. PRIOR HISTORY: Do you have any history of prior problems with your breasts? (e.g., breast cancer, breast implant, fibrocystic breast disease)     denies 5. CAUSE: What do you think is causing this symptom?     unknown 6. OTHER SYMPTOMS: Do you have any other symptoms? (e.g., breast pain, fever, nipple discharge, redness or rash)     Breast pain, otherwise denies 7. PREGNANCY-BREASTFEEDING: Is there any chance you are pregnant? When was your last menstrual period? Are you breastfeeding?     N/a  Protocols used: Headache-A-AH, Breast Symptoms-A-AH

## 2024-03-27 ENCOUNTER — Telehealth: Payer: Self-pay

## 2024-03-27 ENCOUNTER — Ambulatory Visit

## 2024-03-27 NOTE — Telephone Encounter (Signed)
 Tried calling patient 3x to reschedule 03/27/24 appointment, each time phone disconnected.Sent Mychart message to reschedule appointment.

## 2024-03-28 ENCOUNTER — Telehealth: Payer: Self-pay

## 2024-03-28 ENCOUNTER — Ambulatory Visit: Admitting: Nurse Practitioner

## 2024-03-28 NOTE — Telephone Encounter (Signed)
 Pt has an appointment today with Leron Glance, NP @ 4pm acute visit for medication refills. Pt needs to be scheduled a TOC appt so that refills can be refilled enough to cover until that TOC appt. Can someone get pt scheduled a TOC appt either before  arrival or when pt comes in?

## 2024-04-01 ENCOUNTER — Encounter: Payer: Self-pay | Admitting: Internal Medicine

## 2024-04-01 ENCOUNTER — Other Ambulatory Visit: Payer: Self-pay | Admitting: Internal Medicine

## 2024-04-01 ENCOUNTER — Ambulatory Visit: Admitting: Internal Medicine

## 2024-04-01 VITALS — BP 130/82 | HR 65 | Temp 97.9°F | Ht 68.0 in | Wt 248.4 lb

## 2024-04-01 DIAGNOSIS — G43001 Migraine without aura, not intractable, with status migrainosus: Secondary | ICD-10-CM

## 2024-04-01 DIAGNOSIS — L309 Dermatitis, unspecified: Secondary | ICD-10-CM | POA: Diagnosis not present

## 2024-04-01 DIAGNOSIS — N644 Mastodynia: Secondary | ICD-10-CM

## 2024-04-01 DIAGNOSIS — B009 Herpesviral infection, unspecified: Secondary | ICD-10-CM | POA: Diagnosis not present

## 2024-04-01 DIAGNOSIS — I10 Essential (primary) hypertension: Secondary | ICD-10-CM

## 2024-04-01 DIAGNOSIS — G43709 Chronic migraine without aura, not intractable, without status migrainosus: Secondary | ICD-10-CM | POA: Diagnosis not present

## 2024-04-01 DIAGNOSIS — E559 Vitamin D deficiency, unspecified: Secondary | ICD-10-CM

## 2024-04-01 DIAGNOSIS — E785 Hyperlipidemia, unspecified: Secondary | ICD-10-CM | POA: Diagnosis not present

## 2024-04-01 DIAGNOSIS — J309 Allergic rhinitis, unspecified: Secondary | ICD-10-CM

## 2024-04-01 LAB — LIPID PANEL
Cholesterol: 202 mg/dL — ABNORMAL HIGH (ref 0–200)
HDL: 69 mg/dL (ref >39.00)
LDL Cholesterol: 116 mg/dL — ABNORMAL HIGH (ref 0–99)
NonHDL: 132.83
Total CHOL/HDL Ratio: 3
Triglycerides: 84 mg/dL (ref 0.0–149.0)
VLDL: 16.8 mg/dL (ref 0.0–40.0)

## 2024-04-01 LAB — VITAMIN D 25 HYDROXY (VIT D DEFICIENCY, FRACTURES): VITD: 42.07 ng/mL (ref 30.00–100.00)

## 2024-04-01 MED ORDER — LORATADINE 10 MG PO TABS: 10.0000 mg | ORAL_TABLET | Freq: Every day | ORAL | 3 refills | Status: AC | PRN

## 2024-04-01 MED ORDER — HYDROCORTISONE 2.5 % EX OINT: TOPICAL_OINTMENT | Freq: Two times a day (BID) | CUTANEOUS | 11 refills | Status: AC

## 2024-04-01 MED ORDER — VALACYCLOVIR HCL 500 MG PO TABS: 500.0000 mg | ORAL_TABLET | Freq: Every day | ORAL | 3 refills | Status: AC

## 2024-04-01 MED ORDER — LISINOPRIL-HYDROCHLOROTHIAZIDE 10-12.5 MG PO TABS: 1.0000 | ORAL_TABLET | Freq: Every morning | ORAL | 1 refills | Status: AC

## 2024-04-01 MED ORDER — RIZATRIPTAN BENZOATE 10 MG PO TBDP: 10.0000 mg | ORAL_TABLET | ORAL | 11 refills | Status: AC | PRN

## 2024-04-01 NOTE — Assessment & Plan Note (Signed)
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

## 2024-04-01 NOTE — Assessment & Plan Note (Signed)
-   This problem is chronic and well-controlled -Patient blood pressure today is 130/82 -Patient has missed 2 doses of her blood pressure medication 1 on Saturday and 1 this morning secondary to running out of her medications -Will refill lisinopril /HCTZ 10/12.5 mg -No further workup at this time

## 2024-04-01 NOTE — Assessment & Plan Note (Signed)
-   Patient has history of vitamin D  deficiency and was put on high-dose vitamin D  supplementation for 12 weeks -Will recheck vitamin D  level today and see if she needs any additional supplementation -No further workup at this time

## 2024-04-01 NOTE — Progress Notes (Signed)
 Acute Office Visit  Subjective:     Patient ID: Nichole Rodriguez, female    DOB: 1965-08-07, 58 y.o.   MRN: [id number]  Chief Complaint  Patient presents with   Medication Refill   Discussed the use of AI scribe software for clinical note transcription with the patient, who gave verbal consent to proceed.  History of Present Illness Nichole Rodriguez is a 58 year old female with migraines who presents with persistent headaches.  Cephalgia (headache) - Persistent migraines despite treatment with Topamax  and rizatriptan  - Headaches typically frontal, recently also occurring in the occipital region - History of CT scan revealing a lipoma, deemed non-concerning; unclear if related to current headaches  Mastalgia (breast pain) and menstrual irregularity - Right-sided breast tenderness without discharge, redness, or palpable lumps - Associates tenderness with menstrual cycle - No menstrual period for approximately one year, attributed to menopause - Experienced a brief menstrual period after 11 months of amenorrhea - Follow-up mammogram scheduled for October  Hypertension - Managed with lisinopril  and hydrochlorothiazide  - Missed 2 doses due to running out of medications: skipped dose on Saturday, took dose on Sunday, missed usual morning dose today  Herpes simplex virus (hsv) symptoms - On Valtrex  for herpes management - No recent outbreaks - Experienced itching around upper lip, a common site for outbreaks, attributed to stress  Hyperlipidemia - Last blood work showed elevated cholesterol: LDL 128 mg/dL, total cholesterol over 200 mg/dL - Managing with dietary changes and pravastatin  10 mg - Opted not to increase medication dosage  Pruritus (itching) - Intermittent itching, particularly in the ear - Manages symptoms with hydrocortisone  cream, which provides relief by the next morning - Suspects allergy to strawberries, as itching sometimes occurs after consuming certain  wines  Weight management and prediabetes - Concerned about weight and prediabetes - Exercises two to three times per week (walking or gym for 45 minutes) - Finds it challenging to incorporate exercise into her schedule - Considering weight loss programs      Review of Systems  Constitutional: Negative.   HENT: Negative.    Respiratory: Negative.    Cardiovascular: Negative.   Gastrointestinal: Negative.   Genitourinary: Negative.   Musculoskeletal: Negative.   Neurological: Negative.   Psychiatric/Behavioral: Negative.          Objective:    BP 130/82   Pulse 65   Temp 97.9 F (36.6 C)   Ht 5' 8 (1.727 m)   Wt 248 lb 6.4 oz (112.7 kg)   SpO2 98%   BMI 37.77 kg/m    Physical Exam Constitutional:      Appearance: Normal appearance.  HENT:     Head: Normocephalic and atraumatic.     Mouth/Throat:     Pharynx: Oropharynx is clear. No oropharyngeal exudate or posterior oropharyngeal erythema.  Cardiovascular:     Rate and Rhythm: Normal rate and regular rhythm.     Heart sounds: Normal heart sounds.  Pulmonary:     Effort: Pulmonary effort is normal.     Breath sounds: Normal breath sounds. No wheezing, rhonchi or rales.  Abdominal:     General: Bowel sounds are normal. There is no distension.     Palpations: Abdomen is soft.     Tenderness: There is no abdominal tenderness. There is no guarding or rebound.  Musculoskeletal:        General: No swelling or tenderness.     Right lower leg: No edema.     Left lower leg: No edema.  Lymphadenopathy:     Cervical: No cervical adenopathy.  Neurological:     General: No focal deficit present.     Mental Status: She is alert.  Psychiatric:        Mood and Affect: Mood normal.        Behavior: Behavior normal.     No results found for any visits on 04/01/24.      Assessment & Plan:   Problem List Items Addressed This Visit       Cardiovascular and Mediastinum   Essential hypertension - Primary   -  This problem is chronic and well-controlled -Patient blood pressure today is 130/82 -Patient has missed 2 doses of her blood pressure medication 1 on Saturday and 1 this morning secondary to running out of her medications -Will refill lisinopril /HCTZ 10/12.5 mg -No further workup at this time      Relevant Medications   lisinopril -hydrochlorothiazide  (ZESTORETIC ) 10-12.5 MG tablet   Migraine   - Patient has a history of migraines and is currently on Topamax  for prophylaxis as well as rizatriptan  for acute migraine relief -Patient states that she still has recurrent headaches.  Her headaches are usually frontal but she is not getting occasional occipital headaches as well -She did follow-up with a neurologist once she was first diagnosed but has not followed up since -Will put in a referral to neurology for follow-up of migraines -No further workup at this time      Relevant Medications   lisinopril -hydrochlorothiazide  (ZESTORETIC ) 10-12.5 MG tablet   rizatriptan  (MAXALT -MLT) 10 MG disintegrating tablet   Other Relevant Orders   Ambulatory referral to Neurology     Respiratory   Allergic rhinitis   - This is chronic and stable -Will continue with Flonase  and Claritin  daily -Refill for loratadine  sent in today -No further workup at this time      Relevant Medications   loratadine  (CLARITIN ) 10 MG tablet     Musculoskeletal and Integument   Dermatitis   - This problem is chronic and stable -Patient states that she occasionally gets itching inside her right ear as well as on her wrists and she attributes this to an allergy to strawberries -She was prescribed hydrocortisone  which she applies when she gets the pruritus and symptoms resolved by the next morning -I suspect this is likely an allergic reaction and advised her to take Claritin  when she gets the symptoms and see if her symptoms resolve without the hydrocortisone  -Hydrocortisone  was refilled today in case she does need it  but if Claritin  resolves if she would not need further refills of hydrocortisone       Relevant Medications   hydrocortisone  2.5 % ointment     Other   Breast tenderness   - Patient complains of right-sided breast tenderness occurring intermittently -Denies any discharge or palpable lumps -States that the breast tenderness occurs when she would have had her menstrual period but has not had a menstrual period in the last 11 months -Will obtain diagnostic mammogram of her right breast -No further workup at this time      Relevant Orders   MM 3D DIAGNOSTIC MAMMOGRAM UNILATERAL RIGHT BREAST   HSV infection   - Patient is a history of HSV infection and states that she gets some itching over her upper lip over the last week but did not have any outbreaks -Will refill valacyclovir  to prevent outbreaks -No further workup at this time      Relevant Medications   valACYclovir  (VALTREX ) 500 MG  tablet   Hyperlipidemia   - This problem is chronic -Patient is noted to have an elevated LDL of of 128 as well as total cholesterol greater than 200 and was advised to increase her Pravachol  to 20 mg daily -Patient wanted to work on diet and exercise and states that she is continuing her Pravachol  10 mg daily for now -Will recheck her lipid panel today -If this remains elevated would consider increasing her Pravachol  to 20 mg daily      Relevant Medications   lisinopril -hydrochlorothiazide  (ZESTORETIC ) 10-12.5 MG tablet   Other Relevant Orders   Lipid panel   Morbid obesity (HCC)   - Patient with a BMI of 37.7 and associated hypertension and hyperlipidemia -Patient states that she has attempted to lose weight but has not had much success with this -She does try and exercise 2 times a week but finds it difficult to incorporate this into her routine -She is amenable to referral to healthy weight and wellness to help with her weight loss -I have put in this referral today      Relevant Orders    Amb Ref to Medical Weight Management   Vitamin D  deficiency   - Patient has history of vitamin D  deficiency and was put on high-dose vitamin D  supplementation for 12 weeks -Will recheck vitamin D  level today and see if she needs any additional supplementation -No further workup at this time      Relevant Orders   VITAMIN D  25 Hydroxy (Vit-D Deficiency, Fractures)    Meds ordered this encounter  Medications   hydrocortisone  2.5 % ointment    Sig: Apply topically 2 (two) times daily. Prn ears    Dispense:  60 g    Refill:  11    Generic ok   lisinopril -hydrochlorothiazide  (ZESTORETIC ) 10-12.5 MG tablet    Sig: Take 1 tablet by mouth every morning.    Dispense:  90 tablet    Refill:  1   valACYclovir  (VALTREX ) 500 MG tablet    Sig: Take 1 tablet (500 mg total) by mouth daily. For prevention bid x 3-7 days outbreak    Dispense:  120 tablet    Refill:  3    Please consider 90 day supplies to promote better adherence   rizatriptan  (MAXALT -MLT) 10 MG disintegrating tablet    Sig: Take 1 tablet (10 mg total) by mouth as needed for migraine. May repeat in 2 hours if needed    Dispense:  9 tablet    Refill:  11   loratadine  (CLARITIN ) 10 MG tablet    Sig: Take 1 tablet (10 mg total) by mouth daily as needed for allergies.    Dispense:  90 tablet    Refill:  3    Generic ok    No follow-ups on file.  Harrison Zetina, MD

## 2024-04-01 NOTE — Assessment & Plan Note (Signed)
-   Patient has a history of migraines and is currently on Topamax  for prophylaxis as well as rizatriptan  for acute migraine relief -Patient states that she still has recurrent headaches.  Her headaches are usually frontal but she is not getting occasional occipital headaches as well -She did follow-up with a neurologist once she was first diagnosed but has not followed up since -Will put in a referral to neurology for follow-up of migraines -No further workup at this time

## 2024-04-01 NOTE — Assessment & Plan Note (Signed)
-   This is chronic and stable -Will continue with Flonase  and Claritin  daily -Refill for loratadine  sent in today -No further workup at this time

## 2024-04-01 NOTE — Assessment & Plan Note (Signed)
-   Patient is a history of HSV infection and states that she gets some itching over her upper lip over the last week but did not have any outbreaks -Will refill valacyclovir  to prevent outbreaks -No further workup at this time

## 2024-04-01 NOTE — Patient Instructions (Signed)
-   It was a pleasure meeting you today -We will check some blood work on you including a vitamin D  level as well as a cholesterol panel -I will put in a referral to the healthy weight and wellness clinic for you to help with weight loss -I will also put in referral to neurology for you to reassess your headaches -I have put in refills of your medications for you.  We will hold off on vitamin D  refills until your vitamin D  levels have returned -Please contact us  with any questions or concerns or if you need any refills

## 2024-04-01 NOTE — Assessment & Plan Note (Signed)
-   Patient with a BMI of 37.7 and associated hypertension and hyperlipidemia -Patient states that she has attempted to lose weight but has not had much success with this -She does try and exercise 2 times a week but finds it difficult to incorporate this into her routine -She is amenable to referral to healthy weight and wellness to help with her weight loss -I have put in this referral today

## 2024-04-01 NOTE — Assessment & Plan Note (Signed)
-   This problem is chronic -Patient is noted to have an elevated LDL of of 128 as well as total cholesterol greater than 200 and was advised to increase her Pravachol  to 20 mg daily -Patient wanted to work on diet and exercise and states that she is continuing her Pravachol  10 mg daily for now -Will recheck her lipid panel today -If this remains elevated would consider increasing her Pravachol  to 20 mg daily

## 2024-04-01 NOTE — Assessment & Plan Note (Signed)
-   Patient complains of right-sided breast tenderness occurring intermittently -Denies any discharge or palpable lumps -States that the breast tenderness occurs when she would have had her menstrual period but has not had a menstrual period in the last 11 months -Will obtain diagnostic mammogram of her right breast -No further workup at this time

## 2024-04-02 ENCOUNTER — Ambulatory Visit: Payer: Self-pay | Admitting: Internal Medicine

## 2024-04-05 ENCOUNTER — Ambulatory Visit
Admission: RE | Admit: 2024-04-05 | Discharge: 2024-04-05 | Disposition: A | Discharge status: Home / Self Care | Source: Ambulatory Visit | Attending: Internal Medicine | Admitting: Internal Medicine

## 2024-04-05 DIAGNOSIS — N644 Mastodynia: Secondary | ICD-10-CM | POA: Insufficient documentation

## 2024-04-22 ENCOUNTER — Encounter (INDEPENDENT_AMBULATORY_CARE_PROVIDER_SITE_OTHER): Payer: Self-pay

## 2024-06-04 ENCOUNTER — Other Ambulatory Visit: Payer: Self-pay | Admitting: Internal Medicine

## 2024-06-04 DIAGNOSIS — K219 Gastro-esophageal reflux disease without esophagitis: Secondary | ICD-10-CM

## 2024-06-04 MED ORDER — PANTOPRAZOLE SODIUM 40 MG PO TBEC: DELAYED_RELEASE_TABLET | ORAL | 1 refills | Status: AC

## 2024-06-04 NOTE — Telephone Encounter (Signed)
 Copied from CRM [id number]. Topic: Clinical - Medication Refill >> Jun 04, 2024 11:19 AM Ashley R wrote: Medication:  pantoprazole  (PROTONIX ) 40 MG tablet, TOC scheduled for March 26, meds management with Narendra previously   Has the patient contacted their pharmacy? Yes  This is the patient's preferred pharmacy:   [id number] [place], [place] - [address] [address] Phone: [phone] Fax: [phone]  Is this the correct pharmacy for this prescription? Yes If no, delete pharmacy and type the correct one.   Has the prescription been filled recently? Yes  Is the patient out of the medication? Yes  Has the patient been seen for an appointment in the last year OR does the patient have an upcoming appointment? Yes  Can we respond through MyChart? Yes  Agent: Please be advised that Rx refills may take up to 3 business days. We ask that you follow-up with your pharmacy.

## 2024-06-05 ENCOUNTER — Other Ambulatory Visit: Payer: Self-pay | Admitting: Physician Assistant

## 2024-06-05 DIAGNOSIS — R51 Headache with orthostatic component, not elsewhere classified: Secondary | ICD-10-CM

## 2024-06-05 DIAGNOSIS — H539 Unspecified visual disturbance: Secondary | ICD-10-CM

## 2024-06-05 DIAGNOSIS — Z8489 Family history of other specified conditions: Secondary | ICD-10-CM

## 2024-06-05 DIAGNOSIS — R519 Headache, unspecified: Secondary | ICD-10-CM

## 2024-06-09 ENCOUNTER — Ambulatory Visit
Admission: RE | Admit: 2024-06-09 | Discharge: 2024-06-09 | Disposition: A | Source: Ambulatory Visit | Attending: Physician Assistant | Admitting: Physician Assistant

## 2024-06-09 ENCOUNTER — Ambulatory Visit
Admission: RE | Admit: 2024-06-09 | Discharge: 2024-06-09 | Disposition: A | Discharge status: Home / Self Care | Source: Ambulatory Visit | Attending: Physician Assistant | Admitting: Physician Assistant

## 2024-06-09 DIAGNOSIS — Z8489 Family history of other specified conditions: Secondary | ICD-10-CM | POA: Diagnosis present

## 2024-06-09 DIAGNOSIS — R519 Headache, unspecified: Secondary | ICD-10-CM | POA: Diagnosis present

## 2024-06-09 DIAGNOSIS — H539 Unspecified visual disturbance: Secondary | ICD-10-CM | POA: Insufficient documentation

## 2024-06-09 DIAGNOSIS — R51 Headache with orthostatic component, not elsewhere classified: Secondary | ICD-10-CM | POA: Diagnosis present

## 2024-07-29 DIAGNOSIS — Z0289 Encounter for other administrative examinations: Secondary | ICD-10-CM

## 2024-07-29 NOTE — Progress Notes (Unsigned)
 " Office: [phone]  /  Fax: [phone]   Initial Visit    Nichole Rodriguez was seen in clinic today to evaluate for obesity. She is interested in losing weight to improve overall health and reduce the risk of weight related complications. She presents today to review program treatment options, initial physical assessment, and evaluation.     She was referred by: {emreferby:28303}  When asked what else they would like to accomplish? She states: {EMHopetoaccomplish:28304::Adopt a healthier eating pattern and lifestyle,Improve energy levels and physical activity,Improve existing medical conditions,Improve quality of life}  When asked how has your weight affected you? She states: {EMWeightAffected:28305}  Weight history: ***  Highest weight: ***  Some associated conditions: {EMSomeConditions:28306}  Contributing factors: {EMcontributingfactors:28307}  Weight promoting medications identified: {EMWeightpromotingrx:28308}  Prior weight loss attempts: {emweightlossprograms:31590::None}  Current nutrition plan: {EMNutritionplan:28309::None}  Current level of physical activity: {EMcurrentPA:28310::None}  Current or previous pharmacotherapy: {EM previousRx:28311}  Response to medication: {EMResponsetomedication:28312}   Past medical history includes:   Past Medical History:  Diagnosis Date   Allergy    Annual physical exam 12/08/2016   Arthritis    Colon cancer screening    COVID-19    02/2021 paxlovid  had   Dyspepsia    Fatigue 10/20/2017   Fibroids    Fibroids 07/03/2017   Genital herpes    Genital warts    GERD (gastroesophageal reflux disease)    Hair loss 06/28/2018   HSV infection 02/28/2019   Hyperlipidemia    Hypertension    Insomnia 10/20/2017   Lymphocytosis    Lymphocytosis 08/16/2021   Migraine    Multiple food allergies 06/28/2018   Nonintractable headache 02/28/2019   Pelvic pain in female 07/03/2017   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 She was weighed on the bioimpedance scale: There is no height or weight on file to calculate BMI.  Body Fat%:***, Visceral Fat Rating:***, Weight trend over the last 12 months: {emweighttrend:28333}  General:  Alert, oriented and cooperative. Patient is in no acute distress.  Respiratory: Normal respiratory effort, no problems with respiration noted   Gait: able to ambulate independently  Mental Status: Normal mood and affect. Normal behavior. Normal judgment and thought content.   DIAGNOSTIC DATA REVIEWED:  BMET    Component Value Date/Time   NA 141 09/29/2023 1449   K 4.1 09/29/2023 1449   CL 99 09/29/2023 1449   CO2 28 09/29/2023 1449   GLUCOSE 80 09/29/2023 1449   GLUCOSE 78 09/12/2022 1407   BUN 7 09/29/2023 1449   CREATININE 0.89 09/29/2023 1449   CALCIUM  9.8 09/29/2023 1449   Lab Results  Component Value Date   HGBA1C 6.2 (H) 09/29/2023   HGBA1C 5.8 12/07/2016   No results found for: INSULIN CBC    Component Value Date/Time   WBC WILL FOLLOW 09/29/2023 1450   WBC 6.5 09/29/2023 1450   WBC 6.6 09/14/2022 0937   RBC WILL FOLLOW 09/29/2023 1450   RBC 5.36 (H) 09/29/2023 1450   RBC 5.02 09/14/2022 0937   HGB WILL FOLLOW 09/29/2023 1450   HGB 13.9 09/29/2023 1450   HCT WILL FOLLOW 09/29/2023 1450   HCT 43.5 09/29/2023 1450   PLT WILL FOLLOW 09/29/2023 1450   PLT 224 09/29/2023 1450   MCV WILL FOLLOW 09/29/2023 1450   MCV 81 09/29/2023 1450   MCH WILL FOLLOW 09/29/2023 1450   MCH 25.9 (L)  09/29/2023 1450   MCH 27.5 03/14/2022 1050   MCHC WILL FOLLOW 09/29/2023 1450   MCHC 32.0 09/29/2023 1450   MCHC 32.5 09/14/2022 0937   RDW WILL FOLLOW 09/29/2023 1450   RDW 13.3 09/29/2023 1450   Iron/TIBC/Ferritin/ %Sat    Component Value Date/Time   IRON 107 09/11/2017 1459   TIBC 340 09/11/2017 1459   FERRITIN 74 09/11/2017 1459    IRONPCTSAT 31 09/11/2017 1459   Lipid Panel     Component Value Date/Time   CHOL 202 (H) 04/01/2024 0845   CHOL 217 (H) 09/29/2023 1449   TRIG 84.0 04/01/2024 0845   HDL 69.00 04/01/2024 0845   HDL 71 09/29/2023 1449   CHOLHDL 3 04/01/2024 0845   VLDL 16.8 04/01/2024 0845   LDLCALC 116 (H) 04/01/2024 0845   LDLCALC 128 (H) 09/29/2023 1449   Hepatic Function Panel     Component Value Date/Time   PROT 7.1 09/29/2023 1449   ALBUMIN 4.6 09/29/2023 1449   AST 37 09/29/2023 1449   ALT 37 (H) 09/29/2023 1449   ALKPHOS 77 09/29/2023 1449   BILITOT 0.2 09/29/2023 1449      Component Value Date/Time   TSH 2.91 09/12/2022 1407     Assessment and Plan   Gastroesophageal reflux disease without esophagitis  Essential hypertension  Chronic migraine without aura without status migrainosus, not intractable  HSV infection  Vitamin D  deficiency  Abnormal glucose  Mixed hyperlipidemia  Vitamin B 12 deficiency  Low back pain without sciatica, unspecified back pain laterality, unspecified chronicity  Prediabetes  Chronic benign neutropenia  Intracranial atherosclerosis  Duodenal ulcer  Brain lipoma    Assessment and Plan Assessment & Plan         Obesity Treatment / Action Plan:  {EMobesityactionplanscribe:28314::Patient will work on garnering support from family and friends to begin weight loss journey.,Will work on eliminating or reducing the presence of highly palatable, calorie dense foods in the home.,Will complete provided nutritional and psychosocial assessment questionnaire before the next appointment.,Will be scheduled for indirect calorimetry to determine resting energy expenditure in a fasting state.  This will allow us  to create a reduced calorie, high-protein meal plan to promote loss of fat mass while preserving muscle mass.,Counseled on the health benefits of losing 5%-15% of total body weight.,Was counseled on nutritional approaches to  weight loss and benefits of reducing processed foods and consuming plant-based foods and high quality protein as part of nutritional weight management.,Was counseled on pharmacotherapy and role as an adjunct in weight management. }  Obesity Education Performed Today:  She was weighed on the bioimpedance scale and results were discussed and documented in the synopsis.  We discussed obesity as a disease and the importance of a more detailed evaluation of all the factors contributing to the disease.  We discussed the importance of long term lifestyle changes which include nutrition, exercise and behavioral modifications as well as the importance of customizing this to her specific health and social needs.  We discussed the benefits of reaching a healthier weight to alleviate the symptoms of existing conditions and reduce the risks of the biomechanical, metabolic and psychological effects of obesity.  We reviewed the four pillars of obesity medicine and importance of using a multimodal approach.  We reviewed the basic principles in weight management.   Lashawna Katzman appears to be in the action stage of change and states they are ready to start intensive lifestyle modifications and behavioral modifications.  I have spent *** minutes in the care of  the patient today including: {NUMBER 1-10:22536} minutes before the visit reviewing and preparing the chart. *** minutes face-to-face {emfacetoface:32598::assessing and reviewing listed medical problems as outlined in obesity care plan,providing nutritional and behavioral counseling on topics outlined in the obesity care plan,independently interpreting test results and goals of care, as described in assessment and plan,reviewing and discussing biometric information and progress} {NUMBER 1-10:22536} minutes after the visit updating chart and documentation of encounter.  Reviewed by clinician on day of visit: allergies, medications, problem  list, medical history, surgical history, family history, social history, and previous encounter notes pertinent to obesity diagnosis.   Lucas Parker, MD   "

## 2024-07-30 ENCOUNTER — Ambulatory Visit (INDEPENDENT_AMBULATORY_CARE_PROVIDER_SITE_OTHER): Admitting: Physician Assistant

## 2024-07-30 ENCOUNTER — Encounter (INDEPENDENT_AMBULATORY_CARE_PROVIDER_SITE_OTHER): Payer: Self-pay | Admitting: Physician Assistant

## 2024-07-30 VITALS — BP 118/75 | HR 69 | Temp 99.1°F | Ht 68.0 in | Wt 240.0 lb

## 2024-07-30 DIAGNOSIS — E782 Mixed hyperlipidemia: Secondary | ICD-10-CM

## 2024-07-30 DIAGNOSIS — E66812 Obesity, class 2: Secondary | ICD-10-CM

## 2024-07-30 DIAGNOSIS — R7303 Prediabetes: Secondary | ICD-10-CM | POA: Diagnosis not present

## 2024-07-30 DIAGNOSIS — K269 Duodenal ulcer, unspecified as acute or chronic, without hemorrhage or perforation: Secondary | ICD-10-CM

## 2024-07-30 DIAGNOSIS — M545 Low back pain, unspecified: Secondary | ICD-10-CM | POA: Diagnosis not present

## 2024-07-30 DIAGNOSIS — Z780 Asymptomatic menopausal state: Secondary | ICD-10-CM | POA: Diagnosis not present

## 2024-07-30 DIAGNOSIS — B009 Herpesviral infection, unspecified: Secondary | ICD-10-CM

## 2024-07-30 DIAGNOSIS — Z6836 Body mass index (BMI) 36.0-36.9, adult: Secondary | ICD-10-CM

## 2024-07-30 DIAGNOSIS — R7309 Other abnormal glucose: Secondary | ICD-10-CM

## 2024-07-30 DIAGNOSIS — I10 Essential (primary) hypertension: Secondary | ICD-10-CM

## 2024-07-30 DIAGNOSIS — K219 Gastro-esophageal reflux disease without esophagitis: Secondary | ICD-10-CM

## 2024-07-30 DIAGNOSIS — E538 Deficiency of other specified B group vitamins: Secondary | ICD-10-CM

## 2024-07-30 DIAGNOSIS — E559 Vitamin D deficiency, unspecified: Secondary | ICD-10-CM | POA: Diagnosis not present

## 2024-07-30 DIAGNOSIS — I672 Cerebral atherosclerosis: Secondary | ICD-10-CM

## 2024-07-30 DIAGNOSIS — D1779 Benign lipomatous neoplasm of other sites: Secondary | ICD-10-CM

## 2024-07-30 DIAGNOSIS — D708 Other neutropenia: Secondary | ICD-10-CM

## 2024-07-30 DIAGNOSIS — G43709 Chronic migraine without aura, not intractable, without status migrainosus: Secondary | ICD-10-CM

## 2024-08-13 ENCOUNTER — Ambulatory Visit: Payer: Self-pay

## 2024-08-13 ENCOUNTER — Encounter (INDEPENDENT_AMBULATORY_CARE_PROVIDER_SITE_OTHER): Payer: Self-pay

## 2024-08-13 NOTE — Telephone Encounter (Signed)
" ° ° °  Reason for Disposition  Cough  [1] Patient is NOT HIGH RISK AND [2] strongly requests antiviral medicine AND [3] COVID-19 symptoms present < 5 days  Answer Assessment - Initial Assessment Questions Requesting medication for upper respiratory illness. Not established, has TOC in March. Took [phone]  test last night she is unsure if it is positive as second line was very faint. Aware appointment is needed before medication is prescribed, no appointments are available, she will proceed to urgent care.    1. ONSET: When did the cough begin?      08/11/24 2. SEVERITY: How bad is the cough today?      mild 3. SPUTUM: Describe the color of your sputum (e.g., none, dry cough; clear, white, yellow, green)     White with yellow tint 4. HEMOPTYSIS: Are you coughing up any blood? If Yes, ask: How much? (e.g., flecks, streaks, tablespoons, etc.)     Denies  5. DIFFICULTY BREATHING: Are you having difficulty breathing? If Yes, ask: How bad is it? (e.g., mild, moderate, severe)      denies 6. FEVER: Do you have a fever? If Yes, ask: What is your temperature, how was it measured, and when did it start?     Denies  7. CARDIAC HISTORY: Do you have any history of heart disease? (e.g., heart attack, congestive heart failure)       8. LUNG HISTORY: Do you have any history of lung disease?  (e.g., pulmonary embolus, asthma, emphysema)      9. PE RISK FACTORS: Do you have a history of blood clots? (or: recent major surgery, recent prolonged travel, bedridden)      10. OTHER SYMPTOMS: Do you have any other symptoms? (e.g., runny nose, wheezing, chest pain)      Cough is painful, scratchy and itchy throat.  Protocols used: Cough - Acute Productive-A-AH, COVID-19 - Diagnosed or Suspected-A-AH Message from Jean [place] F sent at 08/13/2024 11:45 AM EST  Reason for Triage: Patient experiencing cough, sneezing, scratchy throat, and congestion - wants to know if a medication can be sent to  pharmacy. TOC appt 09/24/24 "

## 2024-08-14 ENCOUNTER — Ambulatory Visit (INDEPENDENT_AMBULATORY_CARE_PROVIDER_SITE_OTHER): Admitting: Internal Medicine

## 2024-08-28 ENCOUNTER — Ambulatory Visit (INDEPENDENT_AMBULATORY_CARE_PROVIDER_SITE_OTHER): Admitting: Internal Medicine

## 2024-08-29 ENCOUNTER — Ambulatory Visit (INDEPENDENT_AMBULATORY_CARE_PROVIDER_SITE_OTHER): Admitting: Internal Medicine

## 2024-09-11 ENCOUNTER — Ambulatory Visit (INDEPENDENT_AMBULATORY_CARE_PROVIDER_SITE_OTHER): Admitting: Internal Medicine

## 2024-09-24 ENCOUNTER — Encounter: Admitting: Nurse Practitioner
